# Patient Record
Sex: Male | Born: 1945 | Race: Black or African American | Hispanic: No | Marital: Married | Smoking: Former smoker
Health system: Southern US, Community
[De-identification: ages and names within clinical notes are randomized; demographics above are authoritative.]

## PROBLEM LIST (undated history)

## (undated) DIAGNOSIS — R3911 Hesitancy of micturition: Secondary | ICD-10-CM

## (undated) DIAGNOSIS — F419 Anxiety disorder, unspecified: Secondary | ICD-10-CM

## (undated) DIAGNOSIS — I10 Essential (primary) hypertension: Secondary | ICD-10-CM

## (undated) DIAGNOSIS — N433 Hydrocele, unspecified: Secondary | ICD-10-CM

## (undated) DIAGNOSIS — N4340 Spermatocele of epididymis, unspecified: Secondary | ICD-10-CM

---

## 1996-03-28 HISTORY — PX: UMBILICAL HERNIA REPAIR: SHX196

## 2011-04-18 NOTE — Progress Notes (Signed)
Pt states is rescheduling in a month because he has the flu.

## 2011-05-24 NOTE — Progress Notes (Signed)
PT STATES WIFE HAD HEART SURG. AND IN HOSPITAL. WILL RESCHEDULE, LM W/ CHASITY TO CALL PT.

## 2011-05-26 ENCOUNTER — Other Ambulatory Visit: Payer: Self-pay | Admitting: Urology

## 2011-05-30 ENCOUNTER — Encounter (HOSPITAL_BASED_OUTPATIENT_CLINIC_OR_DEPARTMENT_OTHER): Admission: RE | Payer: Self-pay | Source: Ambulatory Visit

## 2011-05-30 ENCOUNTER — Ambulatory Visit (HOSPITAL_BASED_OUTPATIENT_CLINIC_OR_DEPARTMENT_OTHER): Admission: RE | Admit: 2011-05-30 | Payer: Self-pay | Source: Ambulatory Visit | Admitting: Urology

## 2011-05-30 SURGERY — EXPLORATION, SCROTUM
Anesthesia: General | Laterality: Right

## 2011-06-10 ENCOUNTER — Encounter (HOSPITAL_COMMUNITY): Payer: Self-pay | Admitting: Emergency Medicine

## 2011-06-10 ENCOUNTER — Emergency Department (HOSPITAL_COMMUNITY)
Admission: EM | Admit: 2011-06-10 | Discharge: 2011-06-10 | Disposition: A | Discharge status: Home / Self Care | Payer: 59 | Attending: Emergency Medicine | Admitting: Emergency Medicine

## 2011-06-10 DIAGNOSIS — L259 Unspecified contact dermatitis, unspecified cause: Secondary | ICD-10-CM | POA: Insufficient documentation

## 2011-06-10 HISTORY — DX: Essential (primary) hypertension: I10

## 2011-06-10 HISTORY — DX: Hesitancy of micturition: R39.11

## 2011-06-10 MED ORDER — HYDROXYZINE HCL 25 MG PO TABS: 25.0000 mg | ORAL_TABLET | Freq: Four times a day (QID) | ORAL | Status: AC

## 2011-06-10 MED ORDER — HYDROCORTISONE 1 % EX OINT: TOPICAL_OINTMENT | Freq: Two times a day (BID) | CUTANEOUS | Status: DC

## 2011-06-10 NOTE — Discharge Instructions (Signed)

## 2011-06-10 NOTE — ED Provider Notes (Signed)
Medical screening examination/treatment/procedure(s) were performed by non-physician practitioner and as supervising physician I was immediately available for consultation/collaboration.   Loren Racer, MD 06/10/11 2257

## 2011-06-10 NOTE — ED Notes (Signed)
PT reports two "small lumps" that started on right forearm yesterday, now has spread to a contact-dermatitis like pattern with small pustules/fluid-filled spots, slight drainage, and red/pink skin around area. Reports itching.

## 2011-06-10 NOTE — ED Provider Notes (Signed)
History     CSN: [id number]  Arrival date & time 06/10/11  1638   First MD Initiated Contact with Patient 06/10/11 1642      No chief complaint on file.   (Consider location/radiation/quality/duration/timing/severity/associated sxs/prior treatment) HPI Comments: Patient presents emergency department chief complaint of pruritic rash on right anterior forearm.  He noticed a rash yesterday and states that it has been spreading.  The patient has not had any treatments on the rash and states that it is nowhere else on his body.  He has not tried any new lotions or products.  [place] not [place] working.  Patient denies fevers, night sweats, chills, and has no other complaints at this time.  The history is provided by the patient.    No past medical history on file.  No past surgical history on file.  No family history on file.  History  Substance Use Topics  . Smoking status: Not on file  . Smokeless tobacco: Not on file  . Alcohol Use: Not on file      Review of Systems  Constitutional: Negative for fever, chills and appetite change.  HENT: Negative for congestion.   Eyes: Negative for visual disturbance.  Respiratory: Negative for shortness of breath.   Cardiovascular: Negative for chest pain and leg swelling.  Gastrointestinal: Negative for abdominal pain.  Genitourinary: Negative for dysuria, urgency and frequency.  Skin: Positive for rash.  Neurological: Negative for dizziness, syncope, weakness, light-headedness, numbness and headaches.  Psychiatric/Behavioral: Negative for confusion.    Allergies  Review of patient's allergies indicates no known allergies.  Home Medications   Current Outpatient Rx  Name Route Sig Dispense Refill  . IBUPROFEN 200 MG PO TABS Oral Take 800 mg by mouth every 6 (six) hours as needed. pain    . LORAZEPAM 0.5 MG PO TABS Oral Take 0.5 mg by mouth daily.    [name] TAMSULOSIN HCL 0.4 MG PO CAPS  Oral Take 0.4 mg by mouth daily.    [name] VALSARTAN-HYDROCHLOROTHIAZIDE 320-25 MG PO TABS Oral Take 1 tablet by mouth daily.      There were no vitals taken for this visit.  Physical Exam  Nursing note and vitals reviewed. Constitutional: He is oriented to person, place, and time. He appears well-developed and well-nourished. No distress.  HENT:  Head: Normocephalic and atraumatic.  Eyes: Conjunctivae and EOM are normal.  Neck: Normal range of motion.  Pulmonary/Chest: Effort normal.  Musculoskeletal: Normal range of motion.  Neurological: He is alert and oriented to person, place, and time.  Skin: Skin is warm and dry. Rash noted. He is not diaphoretic.       Mild dermatitis with papules and pustules located on right anterior forearm in linear streaks. Erythema base, no warmth or weeping.   Psychiatric: He has a normal mood and affect. His behavior is normal.    ED Course  Procedures (including critical care time)  Labs Reviewed - No data to display No results found.   No diagnosis found.    MDM  Contact dermatitis, question poison ivy   Patient will be given a prescription of Atarax and hydrocortisone cream to be used twice daily.  Patient is advised followup with primary care physician or return to emergency department if symptoms worsen, he developed a fever.  One-to-one or base erythema spreads further.  Patient is advised that this could be very contagious and to keep the area free from contact.  Jaci Carrel, [place] 06/10/11 1736

## 2011-06-13 ENCOUNTER — Encounter (HOSPITAL_BASED_OUTPATIENT_CLINIC_OR_DEPARTMENT_OTHER): Payer: Self-pay | Admitting: *Deleted

## 2011-06-13 NOTE — Progress Notes (Signed)
NPO AFTER MN. ARRIVES AT 0615. NEEDS ISTAT AND EKG. REVIEWED [facility] GUIDELINES, WILL BRING MEDS.

## 2011-06-17 NOTE — H&P (Signed)
  Jesus [name] is an 66 y.o. male.   Chief Complaint:Right scrotal swelling/ mass. 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 HPI: Jesus [name] returns today for a follow-up. He wanted to further discuss his upcoming surgery. He is scheduled now for a scrotal exploration and treatment of what may end up being a substantially large multiloculated spermatocele versus some component of hydrocele as well. He has had a markedly enlarged hemi-scrotum. Previous ultrasound has clearly shown this to be some cystic abnormality but the exact etiology spermatocele versus cystic degeneration of the epididymis versus hydrocele of the testes/cord has not been completely determined. Again, he clearly is symptomatic from this and is here today to further discuss the surgery, what to expect from a recovery standpoint, potential complications, etc.   Past Medical History  Diagnosis Date  . Hypertension   . Urinary hesitancy   . Hydrocele, right   . Spermatocele   . Anxiety     Past Surgical History  Procedure Date  . Umbilical hernia repair 1998    History reviewed. No pertinent family history. Social History:  reports that he quit smoking about 53 years ago. His smoking use included Cigarettes. He has never used smokeless tobacco. He reports that he drinks alcohol. He reports that he does not use illicit drugs.  Allergies: No Known Allergies  No current facility-administered medications on file as of .   No current outpatient prescriptions on file as of .    No results found for this or any previous visit (from the past 48 hour(s)). No results found.  Review of Systems - Negative except scrotal swelling, ED, anxiety.  Height 6' (1.829 m), weight 108.863 kg (240 lb). General appearance: alert, cooperative and no distress Neck: no adenopathy and no JVD Resp: clear to auscultation bilaterally Cardio: regular rate and rhythm GI: soft, non-tender; bowel sounds normal; no masses,  no organomegaly Male genitalia: normal,  penis: no lesions or discharge. testes: no masses or tenderness. no hernias Massive r sided scrotal swelling. Testis non-palpable that side.  Extremities: extremities normal, atraumatic, no cyanosis or edema Skin: Skin color, texture, turgor normal. No rashes or lesions Neurologic: Grossly normal  Assessment/Plan [name] clearly has a markedly enlarged scrotum and is on the schedule for a scrotal exploration with correction of the problem coming up in the next couple of weeks. We again answered his questions and went over what to expect, the nature of the surgery and what the findings might be and what to expect from a recovery standpoint. He understands there can be quite a bit of variability between patients and therefore it is difficult to predict with any certainty. Given the size of this hemi-scrotum, we felt that keeping him overnight for observation along with a scrotal drain would make the most sense to reduce the risk of substantial scrotal hematoma. The drain probably can be removed in the morning but may have to go home with him for a few days depending on the situation. He was interested potentially in a temporizing maneuver but I think any type of drainage of this would be a mistake and this ought to be either postponed or dealt with in a more formal way. He wants to go ahead with the procedure and have answered his questions and discussed things with him today.    Nathania Waldman S 06/17/2011, 12:14 PM

## 2011-06-18 ENCOUNTER — Emergency Department (HOSPITAL_COMMUNITY)
Admission: EM | Admit: 2011-06-18 | Discharge: 2011-06-18 | Disposition: A | Discharge status: Home / Self Care | Payer: 59 | Attending: Emergency Medicine | Admitting: Emergency Medicine

## 2011-06-18 ENCOUNTER — Encounter (HOSPITAL_COMMUNITY): Payer: Self-pay

## 2011-06-18 DIAGNOSIS — L259 Unspecified contact dermatitis, unspecified cause: Secondary | ICD-10-CM | POA: Insufficient documentation

## 2011-06-18 DIAGNOSIS — L298 Other pruritus: Secondary | ICD-10-CM | POA: Insufficient documentation

## 2011-06-18 DIAGNOSIS — R21 Rash and other nonspecific skin eruption: Secondary | ICD-10-CM | POA: Insufficient documentation

## 2011-06-18 DIAGNOSIS — Z79899 Other long term (current) drug therapy: Secondary | ICD-10-CM | POA: Insufficient documentation

## 2011-06-18 DIAGNOSIS — L2989 Other pruritus: Secondary | ICD-10-CM | POA: Insufficient documentation

## 2011-06-18 DIAGNOSIS — I10 Essential (primary) hypertension: Secondary | ICD-10-CM | POA: Insufficient documentation

## 2011-06-18 MED ORDER — PREDNISONE 20 MG PO TABS: ORAL_TABLET | ORAL | Status: DC

## 2011-06-18 MED ORDER — DEXAMETHASONE SODIUM PHOSPHATE 10 MG/ML IJ SOLN: 10.0000 mg | Freq: Once | INTRAMUSCULAR | Status: DC

## 2011-06-18 MED ORDER — DEXAMETHASONE SODIUM PHOSPHATE 10 MG/ML IJ SOLN
10.0000 mg | Freq: Once | INTRAMUSCULAR | Status: AC
  Administered 2011-06-18: 10 mg via INTRAMUSCULAR

## 2011-06-18 MED ORDER — PREDNISONE 20 MG PO TABS: ORAL_TABLET | ORAL | Status: AC

## 2011-06-18 NOTE — ED Notes (Signed)
Pt seen here for presenting complaint on 06/10/11. Given cream and tablets for itching taken with no relief.  Pt presents with generalized rash that has spread to face.  Pt here for reevaluation

## 2011-06-18 NOTE — ED Notes (Signed)
Schedule for surgery on Monday for scrotal exploration here at [facility]- does not want to reschedule

## 2011-06-18 NOTE — ED Provider Notes (Signed)
History     CSN: [id number]  Arrival date & time 06/18/11  1735   First MD Initiated Contact with Patient 06/18/11 1807      Chief Complaint  Patient presents with  . Rash    (Consider location/radiation/quality/duration/timing/severity/associated sxs/prior treatment) HPI Comments: Patient presents with a rash. Patient was seen previously in emergency department for the same on 06/10/11. Patient was diagnosed with contact dermatitis and discharged home on hydrocortisone ointment and hydroxyzine. Patient states these did not help. He has also tried calamine lotion and cornstarch at home. Patient states the rash is itchy. He has had blisters with clear drainage. Complains that rash is worse on right arm. Over the past several days he is also had itching around his eyes. Patient does not know what is causing his rash. [place] and he thinks his dog may have gotten into some poison ivy. Of note, patient has a urological procedure scheduled for 2 days. He denies fever, chills, nausea or vomiting.  Patient is a 66 y.o. male presenting with rash. The history is provided by the patient.  Rash  This is a new problem. The current episode started more than 1 week ago. The problem has not changed since onset.The problem is associated with an unknown factor. There has been no fever. The rash is present on the left arm, right arm and face. The patient is experiencing no pain. Associated symptoms include blisters, itching and weeping. Pertinent negatives include no pain. He has tried antihistamines and steriods for the symptoms.    Past Medical History  Diagnosis Date  . Hypertension   . Urinary hesitancy   . Hydrocele, right   . Spermatocele   . Anxiety     Past Surgical History  Procedure Date  . Umbilical hernia repair 1998    No family history on file.  History  Substance Use Topics  . Smoking status: Former Smoker    Types: Cigarettes    Quit date: 03/28/1958  . Smokeless  tobacco: Never Used  . Alcohol Use: Yes     1-2 drinks per week      Review of Systems  Constitutional: Negative for fever.  HENT: Negative for facial swelling, trouble swallowing and neck pain.   Eyes: Negative for redness.  Respiratory: Negative for shortness of breath, wheezing and stridor.   Cardiovascular: Negative for chest pain.  Gastrointestinal: Negative for nausea and vomiting.  Skin: Positive for itching and rash.    Allergies  Review of patient's allergies indicates no known allergies.  Home Medications   Current Outpatient Rx  Name Route Sig Dispense Refill  . DIPHENHYDRAMINE HCL 25 MG PO CAPS Oral Take 25 mg by mouth every 6 (six) hours as needed. For allergy    . HYDROCORTISONE 1 % EX OINT Topical Apply topically 2 (two) times daily. 30 g 0  . HYDROXYZINE HCL 25 MG PO TABS Oral Take 1 tablet (25 mg total) by mouth every 6 (six) hours. 12 tablet 0  . IBUPROFEN 200 MG PO TABS Oral Take 800 mg by mouth every 6 (six) hours as needed. pain    . LORAZEPAM 0.5 MG PO TABS Oral Take 0.5 mg by mouth 2 (two) times daily as needed. For sleep/anxiety    . NEOMYCIN-POLYMYXIN-PRAMOXINE 1 % EX CREA Topical Apply 1 application topically 2 (two) times daily as needed. Apply to rash    . TAMSULOSIN HCL 0.4 MG PO CAPS Oral Take 0.4 mg by mouth daily.    [name]  VALSARTAN-HYDROCHLOROTHIAZIDE 320-25 MG PO TABS Oral Take 1 tablet by mouth daily.    [name] PREDNISONE 20 MG PO TABS  3 Tabs PO Days 1-3, then 2 tabs PO Days 4-6, then 1 tab PO Day 7-9, then Half Tab PO Day 10-12 20 tablet 0    BP 157/106  Pulse 101  Temp(Src) 98.9 F (37.2 C) (Oral)  Resp 20  SpO2 96%  Physical Exam  Nursing note and vitals reviewed. Constitutional: He is oriented to person, place, and time. He appears well-developed and well-nourished.  HENT:  Head: Normocephalic and atraumatic.       Light pink maculopapular areas around the eyes. Mild excoriation noted.  Eyes: Conjunctivae and EOM are normal. Pupils are  equal, round, and reactive to light. Right eye exhibits no discharge. Left eye exhibits no discharge.       Sclera, conjunctivae, and corneas appear normal.   Neck: Normal range of motion. Neck supple.  Pulmonary/Chest: No respiratory distress.  Neurological: He is alert and oriented to person, place, and time.  Skin: Skin is warm and dry.       Dry crust on the right forearm. No active drainage. There are small papules noted. There is excoriation noted. The patient has very minor symptoms similar on the left arm.  Psychiatric: He has a normal mood and affect.    ED Course  Procedures (including critical care time)  Labs Reviewed - No data to display No results found.   1. Dermatitis, contact    6:30PM Patient seen and examined. Medications ordered.   Vital signs reviewed and are as follows: Filed Vitals:   06/18/11 1745  BP: 157/106  Pulse:   Temp:   Resp:    BP 157/106  Pulse 101  Temp(Src) 98.9 F (37.2 C) (Oral)  Resp 20  SpO2 96%  Dr. Adriana Simas has seen patient. I have discussed patient with him.  Decadron given in ED. Patient aware that this could interfere with urological procedure planned in 2 days. Will treat given facial involvement around eyes.   MDM  Rash, consistent with contact dermatitis. Will treat systemically with steroids. No concern for meningitis. No ocular involvement noted. Normal vision.         Renne Crigler, [place] 06/18/11 2010

## 2011-06-18 NOTE — ED Provider Notes (Signed)
Medical screening examination/treatment/procedure(s) were conducted as a shared visit with non-physician practitioner(s) and myself.  I personally evaluated the patient during the encounter.  Rash most consistent with an allergic phenomenon. Will Rx IM steroids and by mouth steroids.  Donnetta Hutching, MD 06/18/11 [phone]

## 2011-06-18 NOTE — Discharge Instructions (Signed)
Please read and follow all provided instructions.  Your diagnoses today include:  1. Dermatitis, contact     Tests performed today include:  Vital signs. See below for your results today.   Medications prescribed:   Prednisone - steroid medication  Take any prescribed medications only as directed.  Home care instructions:  Follow any educational materials contained in this packet.  Follow-up instructions: Please follow-up with your primary care provider in the next 3 days for further evaluation of your symptoms. If you do not have a primary care doctor -- see below for referral information.   Return instructions:   Please return to the Emergency Department if you experience worsening symptoms.   Please return if you have any other emergent concerns.  Additional Information:  Your vital signs today were: BP 157/106  Pulse 101  Temp(Src) 98.9 F (37.2 C) (Oral)  Resp 20  SpO2 96% If your blood pressure (BP) was elevated above 135/85 this visit, please have this repeated by your doctor within one month. -------------- No Primary Care Doctor Call [facility]  [phone] Other agencies that provide inexpensive medical care    [facility]  [phone]    [facility]  [phone]    [facility]  [phone]    [facility]  [phone]    [facility]  [phone]    [facility]  [phone] -------------- RESOURCE GUIDE:  Dental Problems  Patients with Medicaid: [facility] [phone]                                            [phone] [address] Phone:  [phone]                                                   Phone:  [phone]  If unable to pay or uninsured, contact:  [facility] or [facility]. to become qualified for the adult dental clinic.  Chronic Pain Problems Contact [facility]  [phone] Patients need to be  referred by their primary care doctor.  Insufficient Money for Medicine Contact [employer]:  call "211" or [facility] [phone].  Psychological Services [facility]  [phone] [facility]  [phone] [facility] Mental [facility]   [phone] (emergency services [phone])  Substance Abuse Resources [facility]  [phone] [facility] [phone] [facility] [place] [phone] Floydene Flock [phone] Residential & Outpatient Substance Abuse Program  [phone]  Abuse/Neglect [facility] Child Abuse Hotline [phone] [facility] Child Abuse Hotline [phone] (After Hours)  Emergency Shelter [facility] [phone]  Maternity Homes [facility] [place] [facility] [phone] [facility] [phone]  [facility] Resources  [facility] [place]     [employer]                          [facility] Dept. [address] [place]  [address]      [address]  Blondell Reveal Phone:  [phone]                                   Phone:  [phone]                 Phone:  [phone]  [facility] Phone:  [phone]  [facility] Child Abuse Hotline [phone] [phone] (After Hours)

## 2011-06-20 ENCOUNTER — Encounter (HOSPITAL_BASED_OUTPATIENT_CLINIC_OR_DEPARTMENT_OTHER)
Admission: RE | Disposition: A | Discharge status: Home / Self Care | Payer: Self-pay | Source: Ambulatory Visit | Attending: Urology

## 2011-06-20 ENCOUNTER — Ambulatory Visit (HOSPITAL_BASED_OUTPATIENT_CLINIC_OR_DEPARTMENT_OTHER): Payer: 59 | Admitting: Anesthesiology

## 2011-06-20 ENCOUNTER — Ambulatory Visit (HOSPITAL_BASED_OUTPATIENT_CLINIC_OR_DEPARTMENT_OTHER)
Admission: RE | Admit: 2011-06-20 | Discharge: 2011-06-21 | Disposition: A | Discharge status: Home / Self Care | Payer: 59 | Source: Ambulatory Visit | Attending: Urology | Admitting: Urology

## 2011-06-20 ENCOUNTER — Encounter (HOSPITAL_BASED_OUTPATIENT_CLINIC_OR_DEPARTMENT_OTHER): Payer: Self-pay | Admitting: *Deleted

## 2011-06-20 ENCOUNTER — Encounter (HOSPITAL_BASED_OUTPATIENT_CLINIC_OR_DEPARTMENT_OTHER): Payer: Self-pay | Admitting: Anesthesiology

## 2011-06-20 ENCOUNTER — Other Ambulatory Visit: Payer: Self-pay

## 2011-06-20 DIAGNOSIS — N4340 Spermatocele of epididymis, unspecified: Secondary | ICD-10-CM

## 2011-06-20 DIAGNOSIS — I10 Essential (primary) hypertension: Secondary | ICD-10-CM | POA: Insufficient documentation

## 2011-06-20 DIAGNOSIS — N433 Hydrocele, unspecified: Secondary | ICD-10-CM

## 2011-06-20 HISTORY — PX: SCROTAL EXPLORATION: SHX2386

## 2011-06-20 HISTORY — PX: HYDROCELE EXCISION: SHX482

## 2011-06-20 HISTORY — DX: Spermatocele of epididymis, unspecified: N43.40

## 2011-06-20 HISTORY — DX: Hydrocele, unspecified: N43.3

## 2011-06-20 HISTORY — DX: Anxiety disorder, unspecified: F41.9

## 2011-06-20 HISTORY — PX: SPERMATOCELECTOMY: SHX2420

## 2011-06-20 LAB — POCT I-STAT 4, (NA,K, GLUC, HGB,HCT)
Glucose, Bld: 107 mg/dL — ABNORMAL HIGH (ref 70–99)
HCT: 46 % (ref 39.0–52.0)
Hemoglobin: 15.6 g/dL (ref 13.0–17.0)
Potassium: 3.6 mEq/L (ref 3.5–5.1)
Sodium: 142 mEq/L (ref 135–145)

## 2011-06-20 SURGERY — EXPLORATION, SCROTUM
Anesthesia: General | Site: Scrotum | Laterality: Right | Wound class: Clean

## 2011-06-20 MED ORDER — 0.9 % SODIUM CHLORIDE (POUR BTL) OPTIME
TOPICAL | Status: DC | PRN
  Administered 2011-06-20: 500 mL

## 2011-06-20 MED ORDER — CEFAZOLIN SODIUM 1-5 GM-% IV SOLN
1.0000 g | Freq: Three times a day (TID) | INTRAVENOUS | Status: DC
  Administered 2011-06-20 – 2011-06-21 (×3): 1 g via INTRAVENOUS

## 2011-06-20 MED ORDER — HYDROCORTISONE 1 % EX OINT
TOPICAL_OINTMENT | Freq: Two times a day (BID) | CUTANEOUS | Status: DC
  Administered 2011-06-20: 22:00:00 via TOPICAL

## 2011-06-20 MED ORDER — PROPOFOL 10 MG/ML IV EMUL
INTRAVENOUS | Status: DC | PRN
  Administered 2011-06-20: 50 mg via INTRAVENOUS
  Administered 2011-06-20: 250 mg via INTRAVENOUS

## 2011-06-20 MED ORDER — FENTANYL CITRATE 0.05 MG/ML IJ SOLN
25.0000 ug | INTRAMUSCULAR | Status: DC | PRN
  Administered 2011-06-20: 25 ug via INTRAVENOUS
  Administered 2011-06-20: 50 ug via INTRAVENOUS
  Administered 2011-06-20 (×3): 25 ug via INTRAVENOUS

## 2011-06-20 MED ORDER — MORPHINE SULFATE 2 MG/ML IJ SOLN
2.0000 mg | INTRAMUSCULAR | Status: DC | PRN
  Administered 2011-06-20 (×2): 4 mg via INTRAVENOUS
  Administered 2011-06-20: 2 mg via INTRAVENOUS

## 2011-06-20 MED ORDER — HYDROCODONE-ACETAMINOPHEN 5-325 MG PO TABS
1.0000 | ORAL_TABLET | ORAL | Status: DC | PRN
  Administered 2011-06-20 (×2): 1 via ORAL
  Administered 2011-06-20 – 2011-06-21 (×3): 2 via ORAL

## 2011-06-20 MED ORDER — CEFAZOLIN SODIUM 1-5 GM-% IV SOLN
1.0000 g | INTRAVENOUS | Status: AC
  Administered 2011-06-20: 2 g via INTRAVENOUS

## 2011-06-20 MED ORDER — MIDAZOLAM HCL 5 MG/5ML IJ SOLN
INTRAMUSCULAR | Status: DC | PRN
  Administered 2011-06-20: 2 mg via INTRAVENOUS

## 2011-06-20 MED ORDER — MEPERIDINE HCL 25 MG/ML IJ SOLN: 6.2500 mg | INTRAMUSCULAR | Status: DC | PRN

## 2011-06-20 MED ORDER — NEOMYCIN-POLYMYXIN-PRAMOXINE 1 % EX CREA
1.0000 "application " | TOPICAL_CREAM | Freq: Two times a day (BID) | CUTANEOUS | Status: DC
  Administered 2011-06-20: 1 via TOPICAL

## 2011-06-20 MED ORDER — BUPIVACAINE HCL (PF) 0.25 % IJ SOLN
INTRAMUSCULAR | Status: DC | PRN
  Administered 2011-06-20: 9 mL

## 2011-06-20 MED ORDER — ONDANSETRON HCL 4 MG/2ML IJ SOLN: 4.0000 mg | INTRAMUSCULAR | Status: DC | PRN

## 2011-06-20 MED ORDER — VALSARTAN-HYDROCHLOROTHIAZIDE 320-25 MG PO TABS: 1.0000 | ORAL_TABLET | Freq: Every day | ORAL | Status: DC

## 2011-06-20 MED ORDER — FENTANYL CITRATE 0.05 MG/ML IJ SOLN
INTRAMUSCULAR | Status: DC | PRN
  Administered 2011-06-20 (×2): 25 ug via INTRAVENOUS
  Administered 2011-06-20 (×3): 50 ug via INTRAVENOUS

## 2011-06-20 MED ORDER — LORAZEPAM 0.5 MG PO TABS: 0.5000 mg | ORAL_TABLET | Freq: Two times a day (BID) | ORAL | Status: DC | PRN

## 2011-06-20 MED ORDER — LIDOCAINE HCL (CARDIAC) 20 MG/ML IV SOLN
INTRAVENOUS | Status: DC | PRN
  Administered 2011-06-20: 100 mg via INTRAVENOUS

## 2011-06-20 MED ORDER — LACTATED RINGERS IV SOLN
INTRAVENOUS | Status: DC
  Administered 2011-06-20: 07:00:00 via INTRAVENOUS

## 2011-06-20 MED ORDER — ONDANSETRON HCL 4 MG/2ML IJ SOLN
INTRAMUSCULAR | Status: DC | PRN
  Administered 2011-06-20: 4 mg via INTRAVENOUS

## 2011-06-20 MED ORDER — DIPHENHYDRAMINE HCL 25 MG PO CAPS: 25.0000 mg | ORAL_CAPSULE | Freq: Four times a day (QID) | ORAL | Status: DC | PRN

## 2011-06-20 MED ORDER — DEXAMETHASONE SODIUM PHOSPHATE 4 MG/ML IJ SOLN
INTRAMUSCULAR | Status: DC | PRN
  Administered 2011-06-20: 12 mg via INTRAVENOUS

## 2011-06-20 MED ORDER — KCL IN DEXTROSE-NACL 20-5-0.45 MEQ/L-%-% IV SOLN: INTRAVENOUS | Status: DC

## 2011-06-20 MED ORDER — LACTATED RINGERS IV SOLN
INTRAVENOUS | Status: DC
  Administered 2011-06-20: 11:00:00 via INTRAVENOUS

## 2011-06-20 MED ORDER — CEFAZOLIN SODIUM 1-5 GM-% IV SOLN: 1.0000 g | INTRAVENOUS | Status: DC

## 2011-06-20 MED ORDER — TAMSULOSIN HCL 0.4 MG PO CAPS: 0.4000 mg | ORAL_CAPSULE | Freq: Every day | ORAL | Status: DC

## 2011-06-20 MED ORDER — PROMETHAZINE HCL 25 MG/ML IJ SOLN: 6.2500 mg | INTRAMUSCULAR | Status: DC | PRN

## 2011-06-20 MED ORDER — HYDROXYZINE HCL 25 MG PO TABS: 25.0000 mg | ORAL_TABLET | Freq: Four times a day (QID) | ORAL | Status: DC

## 2011-06-20 SURGICAL SUPPLY — 45 items
APPLICATOR COTTON TIP 6IN STRL (MISCELLANEOUS) ×2 IMPLANT
BANDAGE GAUZE ELAST BULKY 4 IN (GAUZE/BANDAGES/DRESSINGS) ×2 IMPLANT
BLADE SURG 10 STRL SS (BLADE) IMPLANT
BLADE SURG 15 STRL LF DISP TIS (BLADE) ×1 IMPLANT
BLADE SURG 15 STRL SS (BLADE) ×1
BLADE SURG ROTATE 9660 (MISCELLANEOUS) ×2 IMPLANT
CANISTER SUCTION 1200CC (MISCELLANEOUS) IMPLANT
CANISTER SUCTION 2500CC (MISCELLANEOUS) ×4 IMPLANT
CLEANER CAUTERY TIP 5X5 PAD (MISCELLANEOUS) ×1 IMPLANT
CLOTH BEACON ORANGE TIMEOUT ST (SAFETY) ×2 IMPLANT
COVER MAYO STAND STRL (DRAPES) ×2 IMPLANT
COVER TABLE BACK 60X90 (DRAPES) ×2 IMPLANT
DISSECTOR ROUND CHERRY 3/8 STR (MISCELLANEOUS) IMPLANT
DRAIN PENROSE 18X1/4 LTX STRL (WOUND CARE) ×2 IMPLANT
DRAPE PED LAPAROTOMY (DRAPES) ×2 IMPLANT
DRSG TEGADERM 4X4.75 (GAUZE/BANDAGES/DRESSINGS) ×2 IMPLANT
ELECT REM PT RETURN 9FT ADLT (ELECTROSURGICAL) ×2
ELECTRODE REM PT RTRN 9FT ADLT (ELECTROSURGICAL) ×1 IMPLANT
GLOVE BIOGEL M 7.0 STRL (GLOVE) ×2 IMPLANT
GOWN ISOL BLUE XXL (GOWNS) ×2 IMPLANT
GOWN SURGICAL XLG (GOWNS) ×2 IMPLANT
LOOP VESSEL MAXI BLUE (MISCELLANEOUS) IMPLANT
NEEDLE HYPO 22GX1.5 SAFETY (NEEDLE) ×2 IMPLANT
NS IRRIG 500ML POUR BTL (IV SOLUTION) ×2 IMPLANT
PACK BASIN DAY SURGERY FS (CUSTOM PROCEDURE TRAY) ×2 IMPLANT
PAD CLEANER CAUTERY TIP 5X5 (MISCELLANEOUS) ×1
PENCIL BUTTON HOLSTER BLD 10FT (ELECTRODE) ×2 IMPLANT
STRIP CLOSURE SKIN 1/2X4 (GAUZE/BANDAGES/DRESSINGS) IMPLANT
SUPPORT SCROTAL LG STRP (MISCELLANEOUS) ×2 IMPLANT
SUT SILK 0 SH 30 (SUTURE) IMPLANT
SUT SILK 0 TIES 10X30 (SUTURE) IMPLANT
SUT SILK 3 0 PS 1 (SUTURE) ×2 IMPLANT
SUT VIC AB 2-0 CT1 27 (SUTURE)
SUT VIC AB 2-0 CT1 TAPERPNT 27 (SUTURE) IMPLANT
SUT VIC AB 3-0 CT1 36 (SUTURE) IMPLANT
SUT VIC AB 3-0 SH 27 (SUTURE) ×2
SUT VIC AB 3-0 SH 27X BRD (SUTURE) ×2 IMPLANT
SUT VICRYL 4-0 PS2 18IN ABS (SUTURE) ×2 IMPLANT
SYR BULB IRRIGATION 50ML (SYRINGE) IMPLANT
SYR CONTROL 10ML LL (SYRINGE) ×2 IMPLANT
TOWEL NATURAL 6PK STERILE (DISPOSABLE) ×2 IMPLANT
TRAY DSU PREP LF (CUSTOM PROCEDURE TRAY) ×2 IMPLANT
TUBE CONNECTING 12X1/4 (SUCTIONS) IMPLANT
WATER STERILE IRR 500ML POUR (IV SOLUTION) IMPLANT
YANKAUER SUCT BULB TIP NO VENT (SUCTIONS) IMPLANT

## 2011-06-20 NOTE — Anesthesia Procedure Notes (Signed)
Procedure Name: LMA Insertion Date/Time: 06/20/2011 7:47 AM Performed by: Norva Pavlov Pre-anesthesia Checklist: Patient identified, Emergency Drugs available, Suction available and Patient being monitored Patient Re-evaluated:Patient Re-evaluated prior to inductionOxygen Delivery Method: Circle System Utilized Preoxygenation: Pre-oxygenation with 100% oxygen Intubation Type: IV induction Ventilation: Mask ventilation without difficulty LMA: LMA inserted LMA Size: 5.0 Number of attempts: 1 Airway Equipment and Method: bite block Placement Confirmation: positive ETCO2 Tube secured with: Tape Dental Injury: Teeth and Oropharynx as per pre-operative assessment

## 2011-06-20 NOTE — Anesthesia Preprocedure Evaluation (Addendum)
Anesthesia Evaluation  Patient identified by MRN, date of birth, ID band Patient awake    Reviewed: Allergy & Precautions, H&P , NPO status , Patient's Chart, lab work & pertinent test results  Airway Mallampati: II TM Distance: >3 FB Neck ROM: full    Dental No notable dental hx.    Pulmonary neg pulmonary ROS,  breath sounds clear to auscultation  Pulmonary exam normal       Cardiovascular Exercise Tolerance: Good hypertension, Pt. on medications negative cardio ROS  Rhythm:regular Rate:Normal     Neuro/Psych negative neurological ROS  negative psych ROS   GI/Hepatic negative GI ROS, Neg liver ROS,   Endo/Other  negative endocrine ROS  Renal/GU negative Renal ROS  negative genitourinary   Musculoskeletal   Abdominal   Peds  Hematology negative hematology ROS (+)   Anesthesia Other Findings Upper front gold tooth  Reproductive/Obstetrics negative OB ROS                          Anesthesia Physical Anesthesia Plan  ASA: II  Anesthesia Plan: General   Post-op Pain Management:    Induction:   Airway Management Planned: LMA  Additional Equipment:   Intra-op Plan:   Post-operative Plan:   Informed Consent: I have reviewed the patients History and Physical, chart, labs and discussed the procedure including the risks, benefits and alternatives for the proposed anesthesia with the patient or authorized representative who has indicated his/her understanding and acceptance.   Dental Advisory Given  Plan Discussed with: CRNA  Anesthesia Plan Comments:        Anesthesia Quick Evaluation

## 2011-06-20 NOTE — Discharge Instructions (Addendum)
Scrotal surgery postoperative instructions  Wound:  In most cases your incision will have absorbable sutures that will dissolve within the first 10-20 days. Some will fall out even earlier. Expect some redness as the sutures dissolved but this should occur only around the sutures. If there is generalized redness, especially with increasing pain or swelling, let us know. The scrotum will very likely get "black and blue" as the blood in the tissues spread. Sometimes the whole scrotum will turn colors. The black and blue is followed by a yellow and brown color. In time, all the discoloration will go away. In some cases some firm swelling in the area of the testicle may persist for up to 4-6 weeks after the surgery and is considered normal in most cases.  Diet:  You may return to your normal diet within 24 hours following your surgery. You may note some mild nausea and possibly vomiting the first 6-8 hours following surgery. This is usually due to the side effects of anesthesia, and will disappear quite soon. I would suggest clear liquids and a very light meal the first evening following your surgery.  Activity:  Your physical activity should be restricted the first 48 hours. During that time you should remain relatively inactive, moving about only when necessary. During the first 7-10 days following surgery he should avoid lifting any heavy objects (anything greater than 15 pounds), and avoid strenuous exercise. If you work, ask [place] specifically about your restrictions, both for work and home. We will write a note to your employer if needed.  You should plan to wear a tight pair of jockey shorts or an athletic supporter for the first 4-5 days, even to sleep. This will keep the scrotum immobilized to some degree and keep the swelling down.  Ice packs should be placed on and off over the scrotum for the first 48 hours. Frozen peas or corn in a ZipLock bag can be frozen, used and re-frozen. Fifteen minutes  on and 15 minutes off is a reasonable schedule. The ice is a good pain reliever and keeps the swelling down.  Hygiene:  You may shower 48 hours after your surgery. Tub bathing should be restricted until the seventh day.          Medication:  You will be sent home with some type of pain medication. In many cases you will be sent home with a narcotic pain pill (Vicodin or Tylox). If the pain is not too bad, you may take either Tylenol (acetaminophen) or Advil (ibuprofen) which contain no narcotic agents, and might be tolerated a little better, with fewer side effects. If the pain medication you are sent home with does not control the pain, you will have to let us know. Some narcotic pain medications cannot be given or refilled by a phone call to a pharmacy.  Problems you should report to [place]:   Fever of 101.0 degrees Fahrenheit or greater.  Moderate or severe swelling under the skin incision or involving the scrotum.  Drug reaction such as hives, a rash, nausea or vomiting.   will call about getting you in on Thursday for drain removal

## 2011-06-20 NOTE — Interval H&P Note (Signed)
History and Physical Interval Note:  06/20/2011 7:36 AM  Jesus [name]  has presented today for surgery, with the diagnosis of Right Hydrocele, Spermatocele  The various methods of treatment have been discussed with the patient and family. After consideration of risks, benefits and other options for treatment, the patient has consented to  Procedure(s) (LRB): SCROTUM EXPLORATION (N/A) as a surgical intervention .  The patients' history has been reviewed, patient examined, no change in status, stable for surgery.  I have reviewed the patients' chart and labs.  Questions were answered to the patient's satisfaction.     Velda Wendt S

## 2011-06-20 NOTE — Transfer of Care (Signed)
Immediate Anesthesia Transfer of Care Note  Patient: Jesus [name]  Procedure(s) Performed: Procedure(s) (LRB): SCROTUM EXPLORATION (Right) HYDROCELECTOMY ADULT (Right) SPERMATOCELECTOMY (Right)  Patient Location: PACU  Anesthesia Type: General  Level of Consciousness: awake, alert  and oriented  Airway & Oxygen Therapy: Patient Spontanous Breathing and Patient connected to face mask oxygen  Post-op Assessment: Report given to PACU RN and Post -op Vital signs reviewed and stable  Post vital signs: Reviewed and stable  Complications: No apparent anesthesia complications

## 2011-06-20 NOTE — Op Note (Signed)
Preoperative diagnosis: Large right hydrocele/spermatocele Postoperative diagnosis: Same  Procedure: Scrotal exploration with drainage and repair of large right loculated hydrocele. Small spermatocele excised   Surgeon: Valetta Fuller M.D.  Anesthesia: Gen.  Indications: Jesus [name] has had very long-standing and progressive right-sided scrotal swelling. This progressed to the point that the patient did have some difficulty with normal activities. His surgical procedures been postponed a couple of time for some medical and social reasons. Previous ultrasound showed a 17+ centimeter septated fluid collection in the right hemiscrotum. The patient underwent extensive discussion about the nature of the procedure the risks benefits and postoperative recovery issues. He is elected to proceed with surgery.     Technique and findings: Patient brought the operating room. Successful induction of general anesthesia. He was placed in supine position and prepped and draped in usual manner. Appropriate surgical timeout was performed. The patient had placement of PAS compression boots and received perioperative Ancef. Incision was made in the median raphae of the scrotum. This was carried down to what was clearly a somewhat thickened hydrocele sac. The hydrocele sac was opened and just over 2000 cc of straw-colored fluid was obtained. The testis was delivered. The hydrocele cell sac itself was multiloculated and some additional compartments were opened up. There were some cystic changes in the epididymis as well. Spermatocele was excised. A large amount of the redundant sac was excised with electrocautery given its thickness. The rest was plicated with some interrupted Vicryl suture. The entire scrotal cavity was copiously irrigated. A Marcaine spermatic cord block was performed. The testis was returned to the right hemiscrotum taking great care not to twist the spermatic cord in any manner. We elected to place a Penrose  drain in the in the dependent portion of the scrotum which was secured with a silk suture. The scrotum was closed with multiple layers of Vicryl suture. Patient was brought to recovery room in stable condition having had no obvious complications or problems.

## 2011-06-20 NOTE — Anesthesia Postprocedure Evaluation (Signed)
  Anesthesia Post-op Note  Patient: Jesus [name]  Procedure(s) Performed: Procedure(s) (LRB): SCROTUM EXPLORATION (Right) HYDROCELECTOMY ADULT (Right) SPERMATOCELECTOMY (Right)  Patient Location: PACU  Anesthesia Type: General  Level of Consciousness: awake and alert   Airway and Oxygen Therapy: Patient Spontanous Breathing  Post-op Pain: mild  Post-op Assessment: Post-op Vital signs reviewed, Patient's Cardiovascular Status Stable, Respiratory Function Stable, Patent Airway and No signs of Nausea or vomiting  Post-op Vital Signs: stable  Complications: No apparent anesthesia complications

## 2011-06-21 ENCOUNTER — Encounter (HOSPITAL_BASED_OUTPATIENT_CLINIC_OR_DEPARTMENT_OTHER): Payer: Self-pay | Admitting: Urology

## 2011-06-21 MED ORDER — HYDROCODONE-ACETAMINOPHEN 5-325 MG PO TABS
1.0000 | ORAL_TABLET | Freq: Four times a day (QID) | ORAL | Status: AC | PRN
Start: 2011-06-21 — End: 2011-07-01

## 2011-11-16 ENCOUNTER — Encounter (HOSPITAL_COMMUNITY): Payer: Self-pay | Admitting: *Deleted

## 2011-11-16 ENCOUNTER — Emergency Department (HOSPITAL_COMMUNITY)
Admission: EM | Admit: 2011-11-16 | Discharge: 2011-11-16 | Disposition: A | Discharge status: Home / Self Care | Payer: 59 | Attending: Emergency Medicine | Admitting: Emergency Medicine

## 2011-11-16 DIAGNOSIS — K0889 Other specified disorders of teeth and supporting structures: Secondary | ICD-10-CM

## 2011-11-16 DIAGNOSIS — Z87891 Personal history of nicotine dependence: Secondary | ICD-10-CM | POA: Insufficient documentation

## 2011-11-16 DIAGNOSIS — I10 Essential (primary) hypertension: Secondary | ICD-10-CM | POA: Insufficient documentation

## 2011-11-16 DIAGNOSIS — K089 Disorder of teeth and supporting structures, unspecified: Secondary | ICD-10-CM | POA: Insufficient documentation

## 2011-11-16 DIAGNOSIS — F411 Generalized anxiety disorder: Secondary | ICD-10-CM | POA: Insufficient documentation

## 2011-11-16 MED ORDER — HYDROCODONE-ACETAMINOPHEN 10-500 MG PO TABS: 1.0000 | ORAL_TABLET | Freq: Four times a day (QID) | ORAL | Status: AC | PRN

## 2011-11-16 MED ORDER — IBUPROFEN 800 MG PO TABS: 800.0000 mg | ORAL_TABLET | Freq: Three times a day (TID) | ORAL | Status: AC | PRN

## 2011-11-16 NOTE — ED Notes (Signed)
Pt states "had this toothache for 3 days, think it's an upper tooth"; pt indicates right side; pt presents with ?impacted right upper wisdom tooth, left upper wisdom tooth with decay.

## 2011-11-16 NOTE — ED Provider Notes (Signed)
History     CSN: [id number]  Arrival date & time 11/16/11  4098   First MD Initiated Contact with Patient 11/16/11 [phone]      Chief Complaint  Patient presents with  . Dental Pain    (Consider location/radiation/quality/duration/timing/severity/associated sxs/prior treatment) HPI Patient since emergency department with right upper third molar pain.  Patient states pain started 2 days ago.  Patient denies nausea, vomiting, fever, throat swelling, difficulty breathing, swelling under the tongue or weakness.  Patient, states that he tried hydrocodone, which he had left over from a previous surgery states that he did not get any relief with this medication.  Patient, states that the pressure seems to make the pain worse.  Past Medical History  Diagnosis Date  . Hypertension   . Urinary hesitancy   . Hydrocele, right   . Spermatocele   . Anxiety     Past Surgical History  Procedure Date  . Umbilical hernia repair 1998  . Scrotal exploration 06/20/2011    Procedure: SCROTUM EXPLORATION;  Surgeon: Valetta Fuller, MD;  Location: [facility];  Service: Urology;  Laterality: Right;  . Hydrocele excision 06/20/2011    Procedure: HYDROCELECTOMY ADULT;  Surgeon: Valetta Fuller, MD;  Location: [facility];  Service: Urology;  Laterality: Right;  . Spermatocelectomy 06/20/2011    Procedure: SPERMATOCELECTOMY;  Surgeon: Valetta Fuller, MD;  Location: [facility];  Service: Urology;  Laterality: Right;    No family history on file.  History  Substance Use Topics  . Smoking status: Former Smoker    Types: Cigarettes    Quit date: 03/28/1958  . Smokeless tobacco: Never Used  . Alcohol Use: Yes     1-2 drinks per week      Review of Systems All other systems negative except as documented in the HPI. All pertinent positives and negatives as reviewed in the HPI.  Allergies  Review of patient's allergies indicates no known allergies.  Home  Medications   Current Outpatient Rx  Name Route Sig Dispense Refill  . HYDROCODONE-ACETAMINOPHEN 5-325 MG PO TABS Oral Take 2 tablets by mouth every 6 (six) hours as needed. Pain    . IBUPROFEN 200 MG PO TABS Oral Take 600 mg by mouth every 6 (six) hours as needed. pain    . LORAZEPAM 1 MG PO TABS Oral Take 1 mg by mouth 2 (two) times daily.    [name] TAMSULOSIN HCL 0.4 MG PO CAPS Oral Take 0.4 mg by mouth daily.    [name] VALSARTAN-HYDROCHLOROTHIAZIDE 320-25 MG PO TABS Oral Take 1 tablet by mouth daily.      BP 145/94  Pulse 94  Temp 98.4 F (36.9 C) (Oral)  Resp 16  Wt 240 lb (108.863 kg)  SpO2 99%  Physical Exam  Nursing note and vitals reviewed. Constitutional: He appears well-developed and well-nourished. No distress.  HENT:  Head: No trismus in the jaw.  Mouth/Throat: Uvula is midline, oropharynx is clear and moist and mucous membranes are normal. Dental caries present. No dental abscesses or uvula swelling.    Cardiovascular: Normal rate, regular rhythm and normal heart sounds.     ED Course  Procedures (including critical care time)  Patient be referred dentistry on call, and told to return here for any worsening in his condition.  Told to rinse with warm water and peroxide 3 times a day.    MDM          Carlyle Dolly, PA-C 11/16/11  0822 

## 2011-11-18 NOTE — ED Provider Notes (Signed)
Medical screening examination/treatment/procedure(s) were performed by non-physician practitioner and as supervising physician I was immediately available for consultation/collaboration.   Hurman Horn, MD 11/18/11 2203

## 2012-05-07 ENCOUNTER — Encounter (HOSPITAL_COMMUNITY): Payer: Self-pay | Admitting: Emergency Medicine

## 2012-05-07 ENCOUNTER — Emergency Department (HOSPITAL_COMMUNITY)
Admission: EM | Admit: 2012-05-07 | Discharge: 2012-05-07 | Disposition: A | Discharge status: Home / Self Care | Payer: 59 | Attending: Emergency Medicine | Admitting: Emergency Medicine

## 2012-05-07 DIAGNOSIS — Z87448 Personal history of other diseases of urinary system: Secondary | ICD-10-CM | POA: Insufficient documentation

## 2012-05-07 DIAGNOSIS — F411 Generalized anxiety disorder: Secondary | ICD-10-CM | POA: Insufficient documentation

## 2012-05-07 DIAGNOSIS — I10 Essential (primary) hypertension: Secondary | ICD-10-CM | POA: Insufficient documentation

## 2012-05-07 DIAGNOSIS — R42 Dizziness and giddiness: Secondary | ICD-10-CM | POA: Insufficient documentation

## 2012-05-07 DIAGNOSIS — R3911 Hesitancy of micturition: Secondary | ICD-10-CM | POA: Insufficient documentation

## 2012-05-07 DIAGNOSIS — Z79899 Other long term (current) drug therapy: Secondary | ICD-10-CM | POA: Insufficient documentation

## 2012-05-07 DIAGNOSIS — R5381 Other malaise: Secondary | ICD-10-CM | POA: Insufficient documentation

## 2012-05-07 DIAGNOSIS — Z87891 Personal history of nicotine dependence: Secondary | ICD-10-CM | POA: Insufficient documentation

## 2012-05-07 LAB — COMPREHENSIVE METABOLIC PANEL
ALT: 22 U/L (ref 0–53)
Alkaline Phosphatase: 66 U/L (ref 39–117)
BUN: 14 mg/dL (ref 6–23)
Chloride: 101 mEq/L (ref 96–112)
GFR calc Af Amer: >90 mL/min (ref >90)
Glucose, Bld: 81 mg/dL (ref 70–99)
Potassium: 3.9 mEq/L (ref 3.5–5.1)
Sodium: 138 mEq/L (ref 135–145)
Total Bilirubin: 0.4 mg/dL (ref 0.3–1.2)

## 2012-05-07 LAB — CBC WITH DIFFERENTIAL/PLATELET
Hemoglobin: 15 g/dL (ref 13.0–17.0)
Lymphocytes Relative: 26 % (ref 12–46)
Lymphs Abs: 1.7 10*3/uL (ref 0.7–4.0)
Monocytes Relative: 6 % (ref 3–12)
Neutro Abs: 4.2 10*3/uL (ref 1.7–7.7)
Neutrophils Relative %: 67 % (ref 43–77)
RBC: 5.18 MIL/uL (ref 4.22–5.81)
WBC: 6.4 10*3/uL (ref 4.0–10.5)

## 2012-05-07 MED ORDER — MECLIZINE HCL 25 MG PO TABS: 25.0000 mg | ORAL_TABLET | Freq: Three times a day (TID) | ORAL | Status: DC | PRN

## 2012-05-07 NOTE — ED Notes (Addendum)
Pt c/o dizziness for several weeks with mild SOB. Pt states it feels like his "sinuses are making him dizzy".  Pt denies cough or chest pain or headache.  Pt states he originally thought his BP meds were making him dizzy so he cut down to a half of a pill but it did not help his dizziness.

## 2012-05-07 NOTE — ED Notes (Signed)
Pt reports dizziness since last week, sts it happens occasionally, pt also sts it may be due to "heart and blood pressure medicine I'm taking"

## 2012-05-07 NOTE — ED Provider Notes (Addendum)
History     CSN: [id number]  Arrival date & time 05/07/12  1402   First MD Initiated Contact with Patient 05/07/12 1501      Chief Complaint  Patient presents with  . Dizziness    (Consider location/radiation/quality/duration/timing/severity/associated sxs/prior treatment) HPI Comments: The patient presents with a two week history of intermittent dizzy episodes.  These seem to occur randomly with no precipitating factors.  These episodes last several minutes then seem to resolve on their own.  He reports a feeling of light-headedness, not a spinning sensation.    Patient is a 67 y.o. male presenting with weakness. The history is provided by the patient.  Weakness This is a new problem. Episode onset: 2 weeks ago. Episode frequency: intermittently. The problem has been gradually worsening. Pertinent negatives include no chest pain and no shortness of breath. Nothing aggravates the symptoms. Nothing relieves the symptoms. He has tried nothing for the symptoms. The treatment provided no relief.    Past Medical History  Diagnosis Date  . Hypertension   . Urinary hesitancy   . Hydrocele, right   . Spermatocele   . Anxiety     Past Surgical History  Procedure Laterality Date  . Umbilical hernia repair  1998  . Scrotal exploration  06/20/2011    Procedure: SCROTUM EXPLORATION;  Surgeon: Valetta Fuller, MD;  Location: [facility];  Service: Urology;  Laterality: Right;  . Hydrocele excision  06/20/2011    Procedure: HYDROCELECTOMY ADULT;  Surgeon: Valetta Fuller, MD;  Location: [facility];  Service: Urology;  Laterality: Right;  . Spermatocelectomy  06/20/2011    Procedure: SPERMATOCELECTOMY;  Surgeon: Valetta Fuller, MD;  Location: [facility];  Service: Urology;  Laterality: Right;    History reviewed. No pertinent family history.  History  Substance Use Topics  . Smoking status: Former Smoker    Types: Cigarettes    Quit date:  03/28/1958  . Smokeless tobacco: Never Used  . Alcohol Use: Yes     Comment: 1-2 drinks per week      Review of Systems  Respiratory: Negative for shortness of breath.   Cardiovascular: Negative for chest pain.  Neurological: Positive for weakness.  All other systems reviewed and are negative.    Allergies  Review of patient's allergies indicates no known allergies.  Home Medications   Current Outpatient Rx  Name  Route  Sig  Dispense  Refill  . ibuprofen (ADVIL,MOTRIN) 200 MG tablet   Oral   Take 400 mg by mouth every 6 (six) hours as needed. pain         . LORazepam (ATIVAN) 1 MG tablet   Oral   Take 1 mg by mouth 2 (two) times daily.         . Tamsulosin HCl (FLOMAX) 0.4 MG CAPS   Oral   Take 0.4 mg by mouth daily.         . valsartan-hydrochlorothiazide (DIOVAN-HCT) 320-25 MG per tablet   Oral   Take 0.5 tablets by mouth daily. Take 1/2 tablet once daily           BP 134/90  Pulse 105  Temp(Src) 97.8 F (36.6 C) (Oral)  Resp 18  SpO2 99%  Physical Exam  Nursing note and vitals reviewed. Constitutional: He appears well-developed and well-nourished. No distress.  HENT:  Head: Normocephalic and atraumatic.  Mouth/Throat: Oropharynx is clear and moist.  Eyes: EOM are normal. Pupils are equal, round, and reactive to  light. No scleral icterus.  Neck: Normal range of motion. Neck supple.  Cardiovascular: Normal rate and regular rhythm.   No murmur heard. Pulmonary/Chest: Effort normal and breath sounds normal. No respiratory distress. He has no wheezes.  Abdominal: Soft. Bowel sounds are normal. He exhibits no distension. There is no tenderness.  Musculoskeletal: Normal range of motion. He exhibits no edema.  Lymphadenopathy:    He has no cervical adenopathy.  Neurological: He is alert. No cranial nerve deficit. Coordination normal.  Skin: Skin is warm and dry. He is not diaphoretic.    ED Course  Procedures (including critical care  time)  Labs Reviewed  CBC WITH DIFFERENTIAL  COMPREHENSIVE METABOLIC PANEL   No results found.   No diagnosis found.   Date: 05/07/2012  Rate: 70's  Rhythm: normal sinus rhythm  QRS Axis: normal  Intervals: normal  ST/T Wave abnormalities: normal  Conduction Disutrbances:none  Narrative Interpretation:   Old EKG Reviewed: unchanged     MDM  The patient presents here with dizzy episodes for the past week.  The cbc and comp appear unremarkable as does the ekg.  At this point, I feel as though he is stable for discharge.  He has recently had an adjustment in his antihypertensives and I have recommended that he follow his blood pressures at home and follow up with his primary doctor in the next week.  This does not sound like vertigo.          Geoffery Lyons, MD 05/07/12 1647  Geoffery Lyons, MD 05/07/12 [phone]

## 2013-08-31 ENCOUNTER — Emergency Department (HOSPITAL_COMMUNITY): Payer: Medicare Other

## 2013-08-31 ENCOUNTER — Encounter (HOSPITAL_COMMUNITY): Payer: Self-pay | Admitting: Emergency Medicine

## 2013-08-31 ENCOUNTER — Emergency Department (HOSPITAL_COMMUNITY)
Admission: EM | Admit: 2013-08-31 | Discharge: 2013-08-31 | Disposition: A | Discharge status: Home / Self Care | Payer: Medicare Other | Attending: Emergency Medicine | Admitting: Emergency Medicine

## 2013-08-31 DIAGNOSIS — F411 Generalized anxiety disorder: Secondary | ICD-10-CM | POA: Diagnosis not present

## 2013-08-31 DIAGNOSIS — Z79899 Other long term (current) drug therapy: Secondary | ICD-10-CM | POA: Insufficient documentation

## 2013-08-31 DIAGNOSIS — Z87891 Personal history of nicotine dependence: Secondary | ICD-10-CM | POA: Diagnosis not present

## 2013-08-31 DIAGNOSIS — R059 Cough, unspecified: Secondary | ICD-10-CM | POA: Diagnosis present

## 2013-08-31 DIAGNOSIS — J069 Acute upper respiratory infection, unspecified: Secondary | ICD-10-CM | POA: Diagnosis not present

## 2013-08-31 DIAGNOSIS — R05 Cough: Secondary | ICD-10-CM | POA: Diagnosis present

## 2013-08-31 DIAGNOSIS — R110 Nausea: Secondary | ICD-10-CM | POA: Insufficient documentation

## 2013-08-31 DIAGNOSIS — Z87448 Personal history of other diseases of urinary system: Secondary | ICD-10-CM | POA: Insufficient documentation

## 2013-08-31 DIAGNOSIS — I10 Essential (primary) hypertension: Secondary | ICD-10-CM | POA: Diagnosis not present

## 2013-08-31 MED ORDER — PREDNISONE 20 MG PO TABS
60.0000 mg | ORAL_TABLET | Freq: Once | ORAL | Status: AC
  Administered 2013-08-31: 60 mg via ORAL
  Filled 2013-08-31: qty 3

## 2013-08-31 MED ORDER — DEXTROMETHORPHAN POLISTIREX 30 MG/5ML PO LQCR: 15.0000 mg | Freq: Two times a day (BID) | ORAL | Status: DC

## 2013-08-31 MED ORDER — PREDNISONE 20 MG PO TABS: 40.0000 mg | ORAL_TABLET | Freq: Every day | ORAL | Status: DC

## 2013-08-31 NOTE — ED Notes (Signed)
Pt reports nonproductive cough last week that subsided last weekend. Pt reports cough has left him "congested and weak all over." Pt denies pain, CP, or SOB.

## 2013-08-31 NOTE — ED Notes (Signed)
Pt transported to CT ?

## 2013-08-31 NOTE — ED Notes (Signed)
Pt states nausea, congestion cough for 1 week.  Unknown for fever.  No vomiting.

## 2013-08-31 NOTE — ED Provider Notes (Signed)
CSN: [id number]     Arrival date & time 08/31/13  1713 History   First MD Initiated Contact with Patient 08/31/13 1750     Chief Complaint  Patient presents with  . Nausea  . Cough     (Consider location/radiation/quality/duration/timing/severity/associated sxs/prior Treatment) HPI Comments: Patient presents to the emergency department with chief complaint of cough, nasal congestion, and chest congestion times one week. Additionally, he reports decreased appetite, and states that because of this, when he takes his medicines, he feels nauseated. He denies any vomiting. Denies any fevers or chills. Denies productive cough. He has not tried taking anything to alleviate his symptoms. There are no aggravating or alleviating factors. He denies chest pain, shortness of breath, or abdominal pain.  The history is provided by the patient. No language interpreter was used.    Past Medical History  Diagnosis Date  . Hypertension   . Urinary hesitancy   . Hydrocele, right   . Spermatocele   . Anxiety    Past Surgical History  Procedure Laterality Date  . Umbilical hernia repair  1998  . Scrotal exploration  06/20/2011    Procedure: SCROTUM EXPLORATION;  Surgeon: Valetta Fuller, MD;  Location: [facility];  Service: Urology;  Laterality: Right;  . Hydrocele excision  06/20/2011    Procedure: HYDROCELECTOMY ADULT;  Surgeon: Valetta Fuller, MD;  Location: [facility];  Service: Urology;  Laterality: Right;  . Spermatocelectomy  06/20/2011    Procedure: SPERMATOCELECTOMY;  Surgeon: Valetta Fuller, MD;  Location: [facility];  Service: Urology;  Laterality: Right;   History reviewed. No pertinent family history. History  Substance Use Topics  . Smoking status: Former Smoker    Types: Cigarettes    Quit date: 03/28/1958  . Smokeless tobacco: Never Used  . Alcohol Use: Yes     Comment: 1-2 drinks per week    Review of Systems  Constitutional:  Negative for fever and chills.  Respiratory: Positive for cough. Negative for shortness of breath.   Cardiovascular: Negative for chest pain.  Gastrointestinal: Negative for nausea, vomiting, diarrhea and constipation.  Genitourinary: Negative for dysuria.      Allergies  Review of patient's allergies indicates no known allergies.  Home Medications   Prior to Admission medications   Medication Sig Start Date End Date Taking? Authorizing Provider  ibuprofen (ADVIL,MOTRIN) 200 MG tablet Take 400 mg by mouth every 6 (six) hours as needed. pain   Yes Historical Provider, MD  LORazepam (ATIVAN) 1 MG tablet Take 0.5 mg by mouth 2 (two) times daily.    Yes Historical Provider, MD  metoprolol succinate (TOPROL-XL) 25 MG 24 hr tablet Take 25 mg by mouth 2 (two) times daily.   Yes Historical Provider, MD  Multiple Vitamin (MULTIVITAMIN WITH MINERALS) TABS tablet Take 1 tablet by mouth daily.   Yes Historical Provider, MD  olmesartan-hydrochlorothiazide (BENICAR HCT) 40-25 MG per tablet Take 1 tablet by mouth daily.   Yes Historical Provider, MD  Tamsulosin HCl (FLOMAX) 0.4 MG CAPS Take 0.4 mg by mouth daily.   Yes Historical Provider, MD   BP 134/92  Pulse 106  Temp(Src) 99.5 F (37.5 C) (Oral)  Resp 20  SpO2 96% Physical Exam  Nursing note and vitals reviewed. Constitutional: He is oriented to person, place, and time. He appears well-developed and well-nourished.  HENT:  Head: Normocephalic and atraumatic.  Eyes: Conjunctivae and EOM are normal. Pupils are equal, round, and reactive to light. Right eye  exhibits no discharge. Left eye exhibits no discharge. No scleral icterus.  Neck: Normal range of motion. Neck supple. No JVD present.  Cardiovascular: Normal rate, regular rhythm and normal heart sounds.  Exam reveals no gallop and no friction rub.   No murmur heard. Pulmonary/Chest: Effort normal and breath sounds normal. No respiratory distress. He has no wheezes. He has no rales. He  exhibits no tenderness.  Lungs are clear to auscultation  Abdominal: Soft. He exhibits no distension and no mass. There is no tenderness. There is no rebound and no guarding.  Musculoskeletal: Normal range of motion. He exhibits no edema and no tenderness.  Neurological: He is alert and oriented to person, place, and time.  Skin: Skin is warm and dry.  Psychiatric: He has a normal mood and affect. His behavior is normal. Judgment and thought content normal.    ED Course  Procedures (including critical care time) Labs Review Labs Reviewed - No data to display  Imaging Review Dg Chest 2 View  08/31/2013   CLINICAL DATA:  Cough and difficulty breathing  EXAM: CHEST  2 VIEW  COMPARISON:  None.  FINDINGS: There is no edema or consolidation. Heart size and pulmonary vascularity are normal. No adenopathy. No bone lesions.  IMPRESSION: No edema or consolidation.   Electronically Signed   By: Bretta BangWilliam  Woodruff M.D.   On: 08/31/2013 18:12     EKG Interpretation None      MDM   Final diagnoses:  Cough  URI (upper respiratory infection)    Pt CXR negative for acute infiltrate. Patients symptoms are consistent with URI, likely viral etiology. Discussed that antibiotics are not indicated for viral infections. Pt will be discharged with symptomatic treatment.  Verbalizes understanding and is agreeable with plan. Pt is hemodynamically stable & in NAD prior to dc.    Roxy Horsemanobert Mileigh Tilley, PA-C 08/31/13 2258

## 2013-08-31 NOTE — Discharge Instructions (Signed)

## 2013-09-03 NOTE — ED Provider Notes (Signed)
Medical screening examination/treatment/procedure(s) were performed by non-physician practitioner and as supervising physician I was immediately available for consultation/collaboration.   EKG Interpretation None        Candyce Churn III, MD 09/03/13 [phone]

## 2014-05-19 ENCOUNTER — Emergency Department (HOSPITAL_COMMUNITY)
Admission: EM | Admit: 2014-05-19 | Discharge: 2014-05-19 | Disposition: A | Discharge status: Home / Self Care | Payer: Medicare Other | Attending: Emergency Medicine | Admitting: Emergency Medicine

## 2014-05-19 ENCOUNTER — Encounter (HOSPITAL_COMMUNITY): Payer: Self-pay | Admitting: Emergency Medicine

## 2014-05-19 DIAGNOSIS — F419 Anxiety disorder, unspecified: Secondary | ICD-10-CM | POA: Diagnosis not present

## 2014-05-19 DIAGNOSIS — H9201 Otalgia, right ear: Secondary | ICD-10-CM | POA: Insufficient documentation

## 2014-05-19 DIAGNOSIS — J3489 Other specified disorders of nose and nasal sinuses: Secondary | ICD-10-CM | POA: Insufficient documentation

## 2014-05-19 DIAGNOSIS — I10 Essential (primary) hypertension: Secondary | ICD-10-CM | POA: Diagnosis not present

## 2014-05-19 DIAGNOSIS — R0981 Nasal congestion: Secondary | ICD-10-CM | POA: Diagnosis not present

## 2014-05-19 DIAGNOSIS — Z87891 Personal history of nicotine dependence: Secondary | ICD-10-CM | POA: Diagnosis not present

## 2014-05-19 DIAGNOSIS — Z79899 Other long term (current) drug therapy: Secondary | ICD-10-CM | POA: Insufficient documentation

## 2014-05-19 DIAGNOSIS — M542 Cervicalgia: Secondary | ICD-10-CM | POA: Diagnosis not present

## 2014-05-19 DIAGNOSIS — Z7952 Long term (current) use of systemic steroids: Secondary | ICD-10-CM | POA: Diagnosis not present

## 2014-05-19 DIAGNOSIS — Z87448 Personal history of other diseases of urinary system: Secondary | ICD-10-CM | POA: Insufficient documentation

## 2014-05-19 MED ORDER — TRAMADOL HCL 50 MG PO TABS: 50.0000 mg | ORAL_TABLET | Freq: Four times a day (QID) | ORAL | Status: DC | PRN

## 2014-05-19 MED ORDER — GUAIFENESIN 100 MG/5ML PO LIQD
100.0000 mg | ORAL | Status: DC | PRN
Start: 2014-05-19 — End: 2019-05-08

## 2014-05-19 MED ORDER — CYCLOBENZAPRINE HCL 10 MG PO TABS: 10.0000 mg | ORAL_TABLET | Freq: Two times a day (BID) | ORAL | Status: DC | PRN

## 2014-05-19 NOTE — ED Notes (Signed)
Pt c/o right ear pain, states he cannot get appointment until next week.

## 2014-05-19 NOTE — ED Provider Notes (Signed)
CSN: [id number]     Arrival date & time 05/19/14  1627 History  This chart was scribed for Junius Finner, PA-C, working with Juliet Rude. Rubin Payor, MD by Jolene Provost, ED Scribe. This patient was seen in room WTR5/WTR5 and the patient's care was started at 5:50 PM.    Chief Complaint  Patient presents with  . Otalgia    HPI  HPI Comments: Jesus [name] is a 69 y.o. male who presents to the Emergency Department complaining of severe right ear pain with associated neck pain that started three days ago. Pt denies cough. Pt endorses associated nasal congestion. Pt states he has an appointment with his PCP next week, but states he could not make it due to pain. Pt states he has been using nasonex nasal spray and sudafed at night. Pt states he has been taking tylenol and ibuprofen every four hours without relief. Pt states he has had a neck injury in the past and this pain feels similar.   Past Medical History  Diagnosis Date  . Hypertension   . Urinary hesitancy   . Hydrocele, right   . Spermatocele   . Anxiety    Past Surgical History  Procedure Laterality Date  . Umbilical hernia repair  1998  . Scrotal exploration  06/20/2011    Procedure: SCROTUM EXPLORATION;  Surgeon: Valetta Fuller, MD;  Location: [facility];  Service: Urology;  Laterality: Right;  . Hydrocele excision  06/20/2011    Procedure: HYDROCELECTOMY ADULT;  Surgeon: Valetta Fuller, MD;  Location: [facility];  Service: Urology;  Laterality: Right;  . Spermatocelectomy  06/20/2011    Procedure: SPERMATOCELECTOMY;  Surgeon: Valetta Fuller, MD;  Location: [facility];  Service: Urology;  Laterality: Right;   History reviewed. No pertinent family history. History  Substance Use Topics  . Smoking status: Former Smoker    Types: Cigarettes    Quit date: 03/28/1958  . Smokeless tobacco: Never Used  . Alcohol Use: Yes     Comment: 1-2 drinks per week    Review of Systems   Constitutional: Negative for fever and chills.  HENT: Positive for congestion, rhinorrhea and sinus pressure.   Respiratory: Negative for cough.   Gastrointestinal: Negative for nausea and vomiting.  All other systems reviewed and are negative.     Allergies  Review of patient's allergies indicates no known allergies.  Home Medications   Prior to Admission medications   Medication Sig Start Date End Date Taking? Authorizing Provider  ibuprofen (ADVIL,MOTRIN) 200 MG tablet Take 400 mg by mouth every 6 (six) hours as needed. pain   Yes Historical Provider, MD  LORazepam (ATIVAN) 1 MG tablet Take 0.5 mg by mouth 2 (two) times daily.    Yes Historical Provider, MD  metoprolol succinate (TOPROL-XL) 25 MG 24 hr tablet Take 25 mg by mouth 2 (two) times daily.   Yes Historical Provider, MD  Multiple Vitamin (MULTIVITAMIN WITH MINERALS) TABS tablet Take 1 tablet by mouth daily.   Yes Historical Provider, MD  olmesartan-hydrochlorothiazide (BENICAR HCT) 40-25 MG per tablet Take 1 tablet by mouth daily.   Yes Historical Provider, MD  oxymetazoline (AFRIN) 0.05 % nasal spray Place 1 spray into both nostrils 2 (two) times daily as needed for congestion.   Yes Historical Provider, MD  Tamsulosin HCl (FLOMAX) 0.4 MG CAPS Take 0.4 mg by mouth daily.   Yes Historical Provider, MD  cyclobenzaprine (FLEXERIL) 10 MG tablet Take 1 tablet (10 mg total)  by mouth 2 (two) times daily as needed for muscle spasms. 05/19/14   Junius FinnerErin O'Malley, PA-C  dextromethorphan (DELSYM) 30 MG/5ML liquid Take 2.5 mLs (15 mg total) by mouth 2 (two) times daily. Patient not taking: Reported on 05/19/2014 08/31/13   Roxy Horsemanobert Browning, PA-C  guaiFENesin (ROBITUSSIN) 100 MG/5ML liquid Take 5-10 mLs (100-200 mg total) by mouth every 4 (four) hours as needed for cough. 05/19/14   Junius FinnerErin O'Malley, PA-C  predniSONE (DELTASONE) 20 MG tablet Take 2 tablets (40 mg total) by mouth daily. Patient not taking: Reported on 05/19/2014 08/31/13   Roxy Horsemanobert  Browning, PA-C  traMADol (ULTRAM) 50 MG tablet Take 1 tablet (50 mg total) by mouth every 6 (six) hours as needed. 05/19/14   Junius FinnerErin O'Malley, PA-C   BP 148/96 mmHg  Pulse 109  Temp(Src) 98 F (36.7 C) (Oral)  Resp 17  SpO2 99% Physical Exam  Constitutional: He is oriented to person, place, and time. He appears well-developed and well-nourished. No distress.  HENT:  Head: Normocephalic and atraumatic.  Right Ear: Hearing, tympanic membrane, external ear and ear canal normal.  Left Ear: Hearing, tympanic membrane, external ear and ear canal normal.  Nose: Mucosal edema and rhinorrhea present.  Mouth/Throat: Uvula is midline, oropharynx is clear and moist and mucous membranes are normal.  Eyes: Pupils are equal, round, and reactive to light.  Neck: Normal range of motion. Neck supple. Muscular tenderness present.    No nuchal rigidity or meningeal signs.  Cardiovascular: Normal rate.   Pulmonary/Chest: Effort normal. No respiratory distress.  Musculoskeletal: Normal range of motion. He exhibits no edema.  Lymphadenopathy:    He has no cervical adenopathy.  Neurological: He is alert and oriented to person, place, and time. Coordination normal.  Skin: Skin is warm and dry. He is not diaphoretic.  Psychiatric: He has a normal mood and affect. His behavior is normal.  Nursing note and vitals reviewed.   ED Course  Procedures  DIAGNOSTIC STUDIES: Oxygen Saturation is 99% on RA, normal by my interpretation.    COORDINATION OF CARE: 5:56 PM Discussed treatment plan with pt at bedside and pt agreed to plan.  Labs Review Labs Reviewed - No data to display  Imaging Review No results found.   EKG Interpretation None      MDM   Final diagnoses:  Otalgia, right  Nasal congestion  Neck pain on left side    Pt presenting to ED with right sided otalgia. TM-normal, no cerumen impaction of evidence of infection. Nasal mucosa edema.  Tenderness to left cervical muscles.  No  meningeal signs. Will tx for URI symptoms as well as muscles spasms on left side of neck.   I personally performed the services described in this documentation, which was scribed in my presence. The recorded information has been reviewed and is accurate.    Junius Finnerrin O'Malley, PA-C 05/19/14 1803  Juliet RudeNathan R. Rubin PayorPickering, MD 05/20/14 [phone]

## 2014-09-13 ENCOUNTER — Encounter (HOSPITAL_COMMUNITY): Payer: Self-pay | Admitting: *Deleted

## 2014-09-13 ENCOUNTER — Encounter (HOSPITAL_COMMUNITY): Admission: EM | Disposition: A | Discharge status: Home / Self Care | Payer: Self-pay | Source: Home / Self Care

## 2014-09-13 ENCOUNTER — Encounter (HOSPITAL_COMMUNITY): Payer: Self-pay | Admitting: Anesthesiology

## 2014-09-13 ENCOUNTER — Emergency Department (HOSPITAL_COMMUNITY): Payer: Medicare Other

## 2014-09-13 ENCOUNTER — Inpatient Hospital Stay (HOSPITAL_COMMUNITY)
Admission: EM | Admit: 2014-09-13 | Discharge: 2014-09-21 | DRG: 393 | Disposition: A | Discharge status: Home / Self Care | Payer: Medicare Other | Attending: Surgery | Admitting: Surgery

## 2014-09-13 DIAGNOSIS — F419 Anxiety disorder, unspecified: Secondary | ICD-10-CM | POA: Diagnosis present

## 2014-09-13 DIAGNOSIS — R7989 Other specified abnormal findings of blood chemistry: Secondary | ICD-10-CM

## 2014-09-13 DIAGNOSIS — Z6835 Body mass index (BMI) 35.0-35.9, adult: Secondary | ICD-10-CM

## 2014-09-13 DIAGNOSIS — I10 Essential (primary) hypertension: Secondary | ICD-10-CM | POA: Diagnosis present

## 2014-09-13 DIAGNOSIS — Z87891 Personal history of nicotine dependence: Secondary | ICD-10-CM

## 2014-09-13 DIAGNOSIS — K436 Other and unspecified ventral hernia with obstruction, without gangrene: Principal | ICD-10-CM | POA: Diagnosis present

## 2014-09-13 DIAGNOSIS — I2699 Other pulmonary embolism without acute cor pulmonale: Secondary | ICD-10-CM | POA: Diagnosis not present

## 2014-09-13 DIAGNOSIS — E869 Volume depletion, unspecified: Secondary | ICD-10-CM | POA: Diagnosis present

## 2014-09-13 DIAGNOSIS — K56600 Partial intestinal obstruction, unspecified as to cause: Secondary | ICD-10-CM

## 2014-09-13 DIAGNOSIS — R000 Tachycardia, unspecified: Secondary | ICD-10-CM | POA: Diagnosis present

## 2014-09-13 DIAGNOSIS — K420 Umbilical hernia with obstruction, without gangrene: Secondary | ICD-10-CM

## 2014-09-13 DIAGNOSIS — R778 Other specified abnormalities of plasma proteins: Secondary | ICD-10-CM | POA: Diagnosis not present

## 2014-09-13 DIAGNOSIS — R1110 Vomiting, unspecified: Secondary | ICD-10-CM | POA: Diagnosis not present

## 2014-09-13 DIAGNOSIS — K56609 Unspecified intestinal obstruction, unspecified as to partial versus complete obstruction: Secondary | ICD-10-CM | POA: Diagnosis present

## 2014-09-13 DIAGNOSIS — I248 Other forms of acute ischemic heart disease: Secondary | ICD-10-CM | POA: Diagnosis not present

## 2014-09-13 LAB — COMPREHENSIVE METABOLIC PANEL
ALK PHOS: 70 U/L (ref 38–126)
ALT: 25 U/L (ref 17–63)
AST: 28 U/L (ref 15–41)
Albumin: 4.6 g/dL (ref 3.5–5.0)
Anion gap: 11 (ref 5–15)
BILIRUBIN TOTAL: 1.1 mg/dL (ref 0.3–1.2)
BUN: 21 mg/dL — AB (ref 6–20)
CHLORIDE: 102 mmol/L (ref 101–111)
CO2: 28 mmol/L (ref 22–32)
CREATININE: 1.13 mg/dL (ref 0.61–1.24)
Calcium: 10.1 mg/dL (ref 8.9–10.3)
GFR calc Af Amer: >60 mL/min (ref >60)
Glucose, Bld: 145 mg/dL — ABNORMAL HIGH (ref 65–99)
POTASSIUM: 3.9 mmol/L (ref 3.5–5.1)
SODIUM: 141 mmol/L (ref 135–145)
Total Protein: 7.8 g/dL (ref 6.5–8.1)

## 2014-09-13 LAB — CBC
HCT: 48.9 % (ref 39.0–52.0)
HEMOGLOBIN: 16.5 g/dL (ref 13.0–17.0)
MCH: 28.9 pg (ref 26.0–34.0)
MCHC: 33.7 g/dL (ref 30.0–36.0)
MCV: 85.8 fL (ref 78.0–100.0)
Platelets: 194 10*3/uL (ref 150–400)
RBC: 5.7 MIL/uL (ref 4.22–5.81)
RDW: 13.8 % (ref 11.5–15.5)
WBC: 13.5 10*3/uL — ABNORMAL HIGH (ref 4.0–10.5)

## 2014-09-13 LAB — LIPASE, BLOOD: Lipase: 14 U/L — ABNORMAL LOW (ref 22–51)

## 2014-09-13 LAB — GLUCOSE, CAPILLARY: GLUCOSE-CAPILLARY: 137 mg/dL — AB (ref 65–99)

## 2014-09-13 SURGERY — REPAIR, HERNIA, LAPAROSCOPIC, ABDOMINAL APPROACH
Anesthesia: General

## 2014-09-13 MED ORDER — SODIUM CHLORIDE 0.9 % IV BOLUS (SEPSIS)
1000.0000 mL | Freq: Once | INTRAVENOUS | Status: AC
  Administered 2014-09-13: 1000 mL via INTRAVENOUS

## 2014-09-13 MED ORDER — HYDROMORPHONE HCL 1 MG/ML IJ SOLN
1.0000 mg | Freq: Once | INTRAMUSCULAR | Status: AC
  Administered 2014-09-13: 1 mg via INTRAVENOUS
  Filled 2014-09-13: qty 1

## 2014-09-13 MED ORDER — HEPARIN SODIUM (PORCINE) 5000 UNIT/ML IJ SOLN
5000.0000 [IU] | Freq: Three times a day (TID) | INTRAMUSCULAR | Status: DC
  Administered 2014-09-13 – 2014-09-16 (×7): 5000 [IU] via SUBCUTANEOUS
  Filled 2014-09-13 (×11): qty 1

## 2014-09-13 MED ORDER — TAMSULOSIN HCL 0.4 MG PO CAPS
0.4000 mg | ORAL_CAPSULE | Freq: Every day | ORAL | Status: DC
  Administered 2014-09-13 – 2014-09-15 (×3): 0.4 mg via ORAL
  Filled 2014-09-13 (×3): qty 1

## 2014-09-13 MED ORDER — IOHEXOL 300 MG/ML  SOLN
100.0000 mL | Freq: Once | INTRAMUSCULAR | Status: AC | PRN
  Administered 2014-09-13: 100 mL via INTRAVENOUS

## 2014-09-13 MED ORDER — IRBESARTAN 75 MG PO TABS: 37.5000 mg | ORAL_TABLET | Freq: Every day | ORAL | Status: DC

## 2014-09-13 MED ORDER — HYDROCODONE-ACETAMINOPHEN 5-325 MG PO TABS
1.0000 | ORAL_TABLET | ORAL | Status: DC | PRN
  Administered 2014-09-14 – 2014-09-20 (×3): 2 via ORAL
  Administered 2014-09-20 (×2): 1 via ORAL
  Administered 2014-09-20: 2 via ORAL
  Administered 2014-09-20: 1 via ORAL
  Administered 2014-09-21 (×2): 2 via ORAL
  Filled 2014-09-13: qty 1
  Filled 2014-09-13 (×4): qty 2
  Filled 2014-09-13: qty 1
  Filled 2014-09-13 (×3): qty 2

## 2014-09-13 MED ORDER — ONDANSETRON HCL 4 MG/2ML IJ SOLN
4.0000 mg | Freq: Four times a day (QID) | INTRAMUSCULAR | Status: DC | PRN
  Administered 2014-09-13 – 2014-09-17 (×9): 4 mg via INTRAVENOUS
  Filled 2014-09-13 (×9): qty 2

## 2014-09-13 MED ORDER — IRBESARTAN 75 MG PO TABS
37.5000 mg | ORAL_TABLET | Freq: Every day | ORAL | Status: DC
  Administered 2014-09-14 (×2): 37.5 mg via ORAL
  Filled 2014-09-13 (×3): qty 0.5

## 2014-09-13 MED ORDER — FENTANYL CITRATE (PF) 100 MCG/2ML IJ SOLN
50.0000 ug | Freq: Once | INTRAMUSCULAR | Status: AC
  Administered 2014-09-13: 50 ug via INTRAVENOUS
  Filled 2014-09-13: qty 2

## 2014-09-13 MED ORDER — METOPROLOL SUCCINATE ER 25 MG PO TB24
25.0000 mg | ORAL_TABLET | Freq: Two times a day (BID) | ORAL | Status: DC
  Administered 2014-09-13 – 2014-09-14 (×3): 25 mg via ORAL
  Filled 2014-09-13 (×5): qty 1

## 2014-09-13 MED ORDER — ONDANSETRON HCL 4 MG/2ML IJ SOLN
4.0000 mg | Freq: Once | INTRAMUSCULAR | Status: AC
  Administered 2014-09-13: 4 mg via INTRAVENOUS
  Filled 2014-09-13: qty 2

## 2014-09-13 MED ORDER — KCL IN DEXTROSE-NACL 20-5-0.45 MEQ/L-%-% IV SOLN
INTRAVENOUS | Status: DC
  Administered 2014-09-13 – 2014-09-14 (×2): 150 mL/h via INTRAVENOUS
  Administered 2014-09-15 – 2014-09-17 (×4): via INTRAVENOUS
  Filled 2014-09-13 (×11): qty 1000

## 2014-09-13 MED ORDER — IOHEXOL 300 MG/ML  SOLN: 50.0000 mL | Freq: Once | INTRAMUSCULAR | Status: DC | PRN

## 2014-09-13 MED ORDER — MORPHINE SULFATE 2 MG/ML IJ SOLN
1.0000 mg | INTRAMUSCULAR | Status: DC | PRN
  Administered 2014-09-13 – 2014-09-14 (×3): 2 mg via INTRAVENOUS
  Filled 2014-09-13 (×3): qty 1

## 2014-09-13 NOTE — ED Notes (Signed)
Pt in CT.

## 2014-09-13 NOTE — ED Provider Notes (Signed)
CSN: [id number]     Arrival date & time 09/13/14  1134 History   First MD Initiated Contact with Patient 09/13/14 1139     Chief Complaint  Patient presents with  . Vomiting     (Consider location/radiation/quality/duration/timing/severity/associated sxs/prior Treatment) The history is provided by the patient.     Jesus [name] is a 69 yo PMH HTN that p/w with emesis. It started last night after eating a large breakfast and salad. He has had 5 episodes of emesis after this with the last episode about an hour ago. He reported the emesis being food contents and then clear it nature. It has been non bloody. Has history of ventral hernia repair years ago. No history of blood clots or travel.  He was given zofran and 500 cc bolus on the was to ED with improvement of his symptoms.  Denis any fever, chills, night sweats, chest pain, SOB, or abdominal pain. Has some crampiness in his abdomen that is secondary to his repeated emesis.   Past Medical History  Diagnosis Date  . Hypertension   . Urinary hesitancy   . Hydrocele, right   . Spermatocele   . Anxiety    Past Surgical History  Procedure Laterality Date  . Umbilical hernia repair  1998  . Scrotal exploration  06/20/2011    Procedure: SCROTUM EXPLORATION;  Surgeon: Valetta Fuller, MD;  Location: [facility];  Service: Urology;  Laterality: Right;  . Hydrocele excision  06/20/2011    Procedure: HYDROCELECTOMY ADULT;  Surgeon: Valetta Fuller, MD;  Location: [facility]  Service: Urology;  Laterality: Right;  . Spermatocelectomy  06/20/2011    Procedure: SPERMATOCELECTOMY;  Surgeon: Valetta Fuller, MD;  Location: [facility];  Service: Urology;  Laterality: Right;   History reviewed. No pertinent family history. History  Substance Use Topics  . Smoking status: Former Smoker    Types: Cigarettes    Quit date: 03/28/1958  . Smokeless tobacco: Never Used  . Alcohol Use: Yes     Comment: 1-2  drinks per week    Review of Systems  Constitutional: Negative for fever and chills.  Respiratory: Negative for shortness of breath and wheezing.   Cardiovascular: Negative for chest pain.  Gastrointestinal: Positive for vomiting and abdominal pain. Negative for diarrhea and constipation.  Skin: Negative for rash.  Neurological: Negative for weakness.      Allergies  Review of patient's allergies indicates no known allergies.  Home Medications   Prior to Admission medications   Medication Sig Start Date End Date Taking? Authorizing Provider  ibuprofen (ADVIL,MOTRIN) 200 MG tablet Take 800 mg by mouth every 6 (six) hours as needed for moderate pain. pain   Yes Historical Provider, MD  LORazepam (ATIVAN) 1 MG tablet Take 0.5 mg by mouth 2 (two) times daily.    Yes Historical Provider, MD  metoprolol succinate (TOPROL-XL) 25 MG 24 hr tablet Take 25 mg by mouth 2 (two) times daily.   Yes Historical Provider, MD  Multiple Vitamin (MULTIVITAMIN WITH MINERALS) TABS tablet Take 1 tablet by mouth daily.   Yes Historical Provider, MD  oxymetazoline (AFRIN) 0.05 % nasal spray Place 1 spray into both nostrils 2 (two) times daily as needed for congestion.   Yes Historical Provider, MD  Tamsulosin HCl (FLOMAX) 0.4 MG CAPS Take 0.4 mg by mouth daily.   Yes Historical Provider, MD  cyclobenzaprine (FLEXERIL) 10 MG tablet Take 1 tablet (10 mg total) by mouth 2 (two)  times daily as needed for muscle spasms. Patient not taking: Reported on 09/13/2014 05/19/14   Junius Finner, PA-C  dextromethorphan (DELSYM) 30 MG/5ML liquid Take 2.5 mLs (15 mg total) by mouth 2 (two) times daily. Patient not taking: Reported on 05/19/2014 08/31/13   Roxy Horseman, PA-C  guaiFENesin (ROBITUSSIN) 100 MG/5ML liquid Take 5-10 mLs (100-200 mg total) by mouth every 4 (four) hours as needed for cough. Patient not taking: Reported on 09/13/2014 05/19/14   Junius Finner, PA-C  predniSONE (DELTASONE) 20 MG tablet Take 2 tablets (40  mg total) by mouth daily. Patient not taking: Reported on 05/19/2014 08/31/13   Roxy Horseman, PA-C  traMADol (ULTRAM) 50 MG tablet Take 1 tablet (50 mg total) by mouth every 6 (six) hours as needed. Patient not taking: Reported on 09/13/2014 05/19/14   Junius Finner, PA-C   BP 139/79 mmHg  Pulse 123  Temp(Src) 98.3 F (36.8 C) (Oral)  Resp 20  SpO2 92% Physical Exam  Constitutional: He is oriented to person, place, and time. He appears well-developed and well-nourished.  HENT:  Head: Normocephalic and atraumatic.  Eyes: Conjunctivae and EOM are normal.  Neck: Normal range of motion.  Cardiovascular: Regular rhythm, normal heart sounds and intact distal pulses.  Tachycardia present.   No murmur heard. Pulmonary/Chest: Effort normal and breath sounds normal. He has no wheezes. He has no rales.  Abdominal: Soft. Bowel sounds are normal. He exhibits distension. There is no tenderness. There is no rebound and no guarding.  Musculoskeletal: Normal range of motion.  Neurological: He is alert and oriented to person, place, and time.  Skin: Skin is warm. No rash noted.    ED Course  Procedures (including critical care time) Labs Review Labs Reviewed  COMPREHENSIVE METABOLIC PANEL - Abnormal; Notable for the following:    Glucose, Bld 145 (*)    BUN 21 (*)    All other components within normal limits  LIPASE, BLOOD - Abnormal; Notable for the following:    Lipase 14 (*)    All other components within normal limits  CBC - Abnormal; Notable for the following:    WBC 13.5 (*)    All other components within normal limits    Imaging Review Ct Abdomen Pelvis W Contrast  09/13/2014   CLINICAL DATA:  Vomiting, abdominal pain  EXAM: CT ABDOMEN AND PELVIS WITH CONTRAST  TECHNIQUE: Multidetector CT imaging of the abdomen and pelvis was performed using the standard protocol following bolus administration of intravenous contrast.  CONTRAST:  OMNIPAQUE IOHEXOL 300 MG/ML  SOLN  COMPARISON:   None.  FINDINGS: Lower chest: Curvilinear bilateral lower lobe scarring or atelectasis. Streak artifact from the patient's left arm obscures detail.  Hepatobiliary: Liver is unremarkable allowing for motion artifact and streak artifact. Gallbladder are unremarkable.  Pancreas: Normal  Spleen: Normal  Adrenals/Urinary Tract: Numerous bilateral renal cortical cysts are identified, largest right upper renal pole 3.4 cm image 15. No hydroureteronephrosis. Adrenal glands are unremarkable. No radiopaque renal or ureteral calculus. Bladder is unremarkable.  Stomach/Bowel: There is distal small bowel obstruction with the point of transition at the level of the terminal ileum as it traverses a complex fat and bowel containing ventral abdominal wall hernia. Example, image 50 series 2. Small bowel feces sign is evident. There is fat stranding surrounding the loops of bowel within the hernia. The colon is decompressed. Normal appendix, image 58.  Vascular/Lymphatic: No aortic aneurysm.  No lymphadenopathy.  Other: No free air or fluid.  Musculoskeletal: Lower lumbar spine  disc degenerative change. No acute osseous abnormality.  IMPRESSION: Distal small bowel obstruction the transition point at the level of the terminal ileum secondary to incarceration within anterior abdominal wall hernia. Stranding is noted surrounding loops of bowel within the hernia and this does confer a risk of ischemia.   Electronically Signed   By: Christiana Pellant M.D.   On: 09/13/2014 14:52     EKG Interpretation None      Medications  HYDROmorphone (DILAUDID) injection 1 mg (not administered)  sodium chloride 0.9 % bolus 1,000 mL (0 mLs Intravenous Stopped 09/13/14 1252)  fentaNYL (SUBLIMAZE) injection 50 mcg (50 mcg Intravenous Given 09/13/14 1252)  HYDROmorphone (DILAUDID) injection 1 mg (1 mg Intravenous Given 09/13/14 1345)  ondansetron (ZOFRAN) injection 4 mg (4 mg Intravenous Given 09/13/14 1345)  iohexol (OMNIPAQUE) 300 MG/ML solution  100 mL (100 mLs Intravenous Contrast Given 09/13/14 1416)    MDM   Final diagnoses:  None    Jesus [name] is p/w repeated emesis. Hx of ventral hernia years ago. He attributes his emesis to what he had eaten the day before. Abdomen is soft and non tender. Will obtain CMP, lipase and CBC. CT ab/pelvis showing distal small bowel obstruction. Dr. Ezzard Standing has been consulted and will evaluate.    Myra Rude, MD PGY-2, [facility] 09/13/2014, 3:28 PM      Myra Rude, MD 09/13/14 1529  Nelva Nay, MD 09/13/14 [phone]

## 2014-09-13 NOTE — ED Notes (Signed)
This gentleman's wife has been a patient here at [facility] for a few days and he has been with her.  She was being treated for a kidney infection.  He isn't sure if he picked up something here or ate something bad from the cafeteria.  He c/o >10x vomiting over last 24 hours and cramping in abdominal area.  No diarrhea.

## 2014-09-13 NOTE — Anesthesia Preprocedure Evaluation (Deleted)
Anesthesia Evaluation  Patient identified by MRN, date of birth, ID band Patient awake    Reviewed: Allergy & Precautions, H&P , NPO status , Patient's Chart, lab work & pertinent test results, reviewed documented beta blocker date and time   Airway Mallampati: II  TM Distance: >3 FB Neck ROM: full    Dental no notable dental hx. (+) Teeth Intact, Caps   Pulmonary neg pulmonary ROS, former smoker,  breath sounds clear to auscultation  Pulmonary exam normal       Cardiovascular Exercise Tolerance: Good hypertension, Pt. on medications and Pt. on home beta blockers negative cardio ROS  Rhythm:regular Rate:Normal     Neuro/Psych Anxiety negative neurological ROS     GI/Hepatic negative GI ROS, Neg liver ROS,   Endo/Other  Hyperglycemia Obesity  Renal/GU negative Renal ROSElevated BUN  negative genitourinary   Musculoskeletal Incarcerated ventral hernia   Abdominal   Peds  Hematology negative hematology ROS (+)   Anesthesia Other Findings Upper front gold tooth  Reproductive/Obstetrics negative OB ROS                            Anesthesia Physical  Anesthesia Plan  ASA: II  Anesthesia Plan: General   Post-op Pain Management:    Induction: Intravenous, Rapid sequence and Cricoid pressure planned  Airway Management Planned: Oral ETT  Additional Equipment:   Intra-op Plan:   Post-operative Plan: Extubation in OR  Informed Consent: I have reviewed the patients History and Physical, chart, labs and discussed the procedure including the risks, benefits and alternatives for the proposed anesthesia with the patient or authorized representative who has indicated his/her understanding and acceptance.   Dental advisory given  Plan Discussed with: CRNA, Anesthesiologist and Surgeon  Anesthesia Plan Comments:         Anesthesia Quick Evaluation

## 2014-09-13 NOTE — ED Notes (Signed)
MD at bedside - Dr. Ezzard Standing

## 2014-09-13 NOTE — ED Notes (Signed)
Report called to [place], RN 5 Chad.

## 2014-09-13 NOTE — ED Notes (Signed)
MD at bedside. 

## 2014-09-13 NOTE — Consult Note (Signed)
Re:   Jesus [name] DOB:   [birth year] MRN:   [id number]  ASSESSMENT AND PLAN: 1.  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  Ventral hernia  Prior repair in [place] about 20 years ago.  He has had a known recurrence, just decided to leave it alone  The hernia is mainly reducible.    I do not think that the hernia needs emergency surgery tonight.  But I think with evidence of a SBO on CT, he is best kept overnight to make sure that the bowel is open.  I discussed surgery for hernia and that this is probably best accomplished electively.  He agrees with plan.  3.  Morbid obesity  Weight - 250, height - 71 inches (his report)  BMI - 35 4.  HTN   Chief Complaint  Patient presents with  . Vomiting   REFERRING PHYSICIAN: Pcp Not In System  HISTORY OF PRESENT ILLNESS: Jesus [name] is a 69 y.o. (DOB: Oct 08, 1945)  AA  male whose primary care physician is Pcp Not In System (Dr. Clarisa Schools, [facility]) and comes to [facility] ER today for abdominal pain, nausea, and vomiting.  He came to the hospital to pick up his wife, who was admitted for a UTI.  She was going home today.  He started having abdominal pain Friday, 6/17.  He has vomited about 6 times since the pain begain.  His last BM was yesterday. He has never had a colonoscopy.  He had an abdominal wall hernia repaired in [place] about 1996.  He has known the hernia recurred, but has not had anything done about it. He has no stomach, liver, or pancreas disease.  CT scan of abdomen - 09/13/2014 - Distal small bowel obstruction the transition point at the level of the terminal ileum secondary to incarceration within anterior abdominal wall hernia. Stranding is noted surrounding loops of bowel within the hernia and this does confer a risk of ischemia.  Past Medical History  Diagnosis Date  . Hypertension   . Urinary hesitancy   . Hydrocele, right   . Spermatocele   . Anxiety       Past Surgical  History  Procedure Laterality Date  . Umbilical hernia repair  1998  . Scrotal exploration  06/20/2011    Procedure: SCROTUM EXPLORATION;  Surgeon: Valetta Fuller, MD;  Location: [facility];  Service: Urology;  Laterality: Right;  . Hydrocele excision  06/20/2011    Procedure: HYDROCELECTOMY ADULT;  Surgeon: Valetta Fuller, MD;  Location: [facility];  Service: Urology;  Laterality: Right;  . Spermatocelectomy  06/20/2011    Procedure: SPERMATOCELECTOMY;  Surgeon: Valetta Fuller, MD;  Location: [facility];  Service: Urology;  Laterality: Right;      No current facility-administered medications for this encounter.   Current Outpatient Prescriptions  Medication Sig Dispense Refill  . ibuprofen (ADVIL,MOTRIN) 200 MG tablet Take 800 mg by mouth every 6 (six) hours as needed for moderate pain. pain    . LORazepam (ATIVAN) 1 MG tablet Take 0.5 mg by mouth 2 (two) times daily.     . metoprolol succinate (TOPROL-XL) 25 MG 24 hr tablet Take 25 mg by mouth 2 (two) times daily.    . Multiple Vitamin (MULTIVITAMIN WITH MINERALS) TABS tablet Take 1 tablet by mouth daily.    [name] oxymetazoline (AFRIN) 0.05 % nasal spray Place 1 spray into both nostrils 2 (two) times  daily as needed for congestion.    . Tamsulosin HCl (FLOMAX) 0.4 MG CAPS Take 0.4 mg by mouth daily.    . cyclobenzaprine (FLEXERIL) 10 MG tablet Take 1 tablet (10 mg total) by mouth 2 (two) times daily as needed for muscle spasms. (Patient not taking: Reported on 09/13/2014) 10 tablet 0  . dextromethorphan (DELSYM) 30 MG/5ML liquid Take 2.5 mLs (15 mg total) by mouth 2 (two) times daily. (Patient not taking: Reported on 05/19/2014) 89 mL 0  . guaiFENesin (ROBITUSSIN) 100 MG/5ML liquid Take 5-10 mLs (100-200 mg total) by mouth every 4 (four) hours as needed for cough. (Patient not taking: Reported on 09/13/2014) 60 mL 0  . predniSONE (DELTASONE) 20 MG tablet Take 2 tablets (40 mg total) by mouth daily.  (Patient not taking: Reported on 05/19/2014) 10 tablet 0  . traMADol (ULTRAM) 50 MG tablet Take 1 tablet (50 mg total) by mouth every 6 (six) hours as needed. (Patient not taking: Reported on 09/13/2014) 15 tablet 0     No Known Allergies  REVIEW OF SYSTEMS: Skin:  No history of rash.  No history of abnormal moles. Infection:  No history of hepatitis or HIV.  No history of MRSA. Neurologic:  No history of stroke.  No history of seizure.  No history of headaches. Cardiac:  HTN x 20 years.  No history of heart disease. Pulmonary:  Does not smoke cigarettes.  No asthma or bronchitis.  No OSA/CPAP.  Endocrine:  No diabetes. No thyroid disease. Gastrointestinal:  See HPI. Urologic:  Right hydrocele - 06/20/2011 - [place] Musculoskeletal:  No history of joint or back disease. Hematologic:  No bleeding disorder.  No history of anemia.  Not anticoagulated. Psycho-social:  The patient is oriented.   The patient has no obvious psychologic or social impairment to understanding our conversation and plan.  SOCIAL and FAMILY HISTORY: Married. His wife, Jesus [name], is with him.  She was just discharged from the hospital today. He is retired from working in [place], 2002. He has 4 children - 2 in [place], 1 in [place], and 1 in [place] (daughter).  One daughter is a Engineer, civil (consulting).  PHYSICAL EXAM: BP 163/91 mmHg  Pulse 115  Temp(Src) 98.5 F (36.9 C) (Oral)  Resp 20  SpO2 92%  General: Moderately obese AA M who is alert and generally healthy appearing.  HEENT: Normal. Pupils equal. Neck: Supple. No mass.  No thyroid mass. Lymph Nodes:  No supraclavicular or cervical nodes. Lungs: Clear to auscultation and symmetric breath sounds.  Scar over sternum. Heart:  RRR. No murmur or rub. Abdomen: Soft.  Scar at umbilicus.  He has a reducible hernia that spans at least 10 cm.  The defect feels like a swiss cheese defect.  He has no localized mass or tenderness.  BS are present. Rectal: Not done. Extremities:  Good  strength and ROM  in upper and lower extremities. Neurologic:  Grossly intact to motor and sensory function. Psychiatric: Has normal mood and affect. Behavior is normal.   DATA REVIEWED: Epic notes  Ovidio Kin, MD,  [facility] [address] [place]  [address]   [place], [place]    [address] Phone:  [phone] FAX:  [phone]

## 2014-09-14 ENCOUNTER — Encounter (HOSPITAL_COMMUNITY): Payer: Self-pay | Admitting: *Deleted

## 2014-09-14 ENCOUNTER — Observation Stay (HOSPITAL_COMMUNITY): Payer: Medicare Other

## 2014-09-14 LAB — BASIC METABOLIC PANEL
Anion gap: 9 (ref 5–15)
BUN: 20 mg/dL (ref 6–20)
CO2: 29 mmol/L (ref 22–32)
Calcium: 8.7 mg/dL — ABNORMAL LOW (ref 8.9–10.3)
Chloride: 105 mmol/L (ref 101–111)
Creatinine, Ser: 1.04 mg/dL (ref 0.61–1.24)
GFR calc non Af Amer: >60 mL/min (ref >60)
Glucose, Bld: 118 mg/dL — ABNORMAL HIGH (ref 65–99)
POTASSIUM: 4 mmol/L (ref 3.5–5.1)
SODIUM: 143 mmol/L (ref 135–145)

## 2014-09-14 LAB — CBC
HEMATOCRIT: 44.9 % (ref 39.0–52.0)
Hemoglobin: 14.8 g/dL (ref 13.0–17.0)
MCH: 28.8 pg (ref 26.0–34.0)
MCHC: 33 g/dL (ref 30.0–36.0)
MCV: 87.4 fL (ref 78.0–100.0)
PLATELETS: 191 10*3/uL (ref 150–400)
RBC: 5.14 MIL/uL (ref 4.22–5.81)
RDW: 14.1 % (ref 11.5–15.5)
WBC: 4.2 10*3/uL (ref 4.0–10.5)

## 2014-09-14 MED ORDER — ACETAMINOPHEN 325 MG PO TABS: 650.0000 mg | ORAL_TABLET | ORAL | Status: DC | PRN

## 2014-09-14 NOTE — Progress Notes (Signed)
Patient ID: Jesus [name], male   DOB: 04-07-1945, 69 y.o.   MRN: [id number]  General Surgery - [facility]  HD#: 2  Subjective: Patient feeling much improved this AM. Denies nausea or emesis.  Pain in abdomen a "1" out of ten.  Objective: Vital signs in last 24 hours: Temp:  [97.7 F (36.5 C)-99.5 F (37.5 C)] 98 F (36.7 C) (06/19 0453) Pulse Rate:  [96-123] 97 (06/19 0453) Resp:  [18-22] 18 (06/19 0453) BP: (117-163)/(65-101) 131/82 mmHg (06/19 0453) SpO2:  [92 %-96 %] 95 % (06/19 0453) Weight:  [110.269 kg (243 lb 1.6 oz)] 110.269 kg (243 lb 1.6 oz) (06/18 1951) Last BM Date: 09/11/14  Intake/Output from previous day: 06/18 0701 - 06/19 0700 In: 2570 [P.O.:120; I.V.:450; IV Piggyback:2000] Out: 625 [Urine:625] Intake/Output this shift: Total I/O In: -  Out: 75 [Urine:75]  Physical Exam: HEENT - sclerae clear, mucous membranes moist Neck - soft Chest - clear bilaterally Cor - RRR Abdomen - soft, protuberant; hernia soft, minimally tender, reducible Ext - no edema, non-tender Neuro - alert & oriented, no focal deficits  Lab Results:   Recent Labs  09/13/14 1222 09/14/14 0528  WBC 13.5* 4.2  HGB 16.5 14.8  HCT 48.9 44.9  PLT 194 191   BMET  Recent Labs  09/13/14 1222 09/14/14 0528  NA 141 143  K 3.9 4.0  CL 102 105  CO2 28 29  GLUCOSE 145* 118*  BUN 21* 20  CREATININE 1.13 1.04  CALCIUM 10.1 8.7*   PT/INR No results for input(s): LABPROT, INR in the last 72 hours. Comprehensive Metabolic Panel:    Component Value Date/Time   NA 143 09/14/2014 0528   NA 141 09/13/2014 1222   K 4.0 09/14/2014 0528   K 3.9 09/13/2014 1222   CL 105 09/14/2014 0528   CL 102 09/13/2014 1222   CO2 29 09/14/2014 0528   CO2 28 09/13/2014 1222   BUN 20 09/14/2014 0528   BUN 21* 09/13/2014 1222   CREATININE 1.04 09/14/2014 0528   CREATININE 1.13 09/13/2014 1222   GLUCOSE 118* 09/14/2014 0528   GLUCOSE 145* 09/13/2014 1222   CALCIUM 8.7*  09/14/2014 0528   CALCIUM 10.1 09/13/2014 1222   AST 28 09/13/2014 1222   AST 25 05/07/2012 1550   ALT 25 09/13/2014 1222   ALT 22 05/07/2012 1550   ALKPHOS 70 09/13/2014 1222   ALKPHOS 66 05/07/2012 1550   BILITOT 1.1 09/13/2014 1222   BILITOT 0.4 05/07/2012 1550   PROT 7.8 09/13/2014 1222   PROT 7.6 05/07/2012 1550   ALBUMIN 4.6 09/13/2014 1222   ALBUMIN 4.2 05/07/2012 1550    Studies/Results: Ct Abdomen Pelvis W Contrast  09/13/2014   CLINICAL DATA:  Vomiting, abdominal pain  EXAM: CT ABDOMEN AND PELVIS WITH CONTRAST  TECHNIQUE: Multidetector CT imaging of the abdomen and pelvis was performed using the standard protocol following bolus administration of intravenous contrast.  CONTRAST:  OMNIPAQUE IOHEXOL 300 MG/ML  SOLN  COMPARISON:  None.  FINDINGS: Lower chest: Curvilinear bilateral lower lobe scarring or atelectasis. Streak artifact from the patient's left arm obscures detail.  Hepatobiliary: Liver is unremarkable allowing for motion artifact and streak artifact. Gallbladder are unremarkable.  Pancreas: Normal  Spleen: Normal  Adrenals/Urinary Tract: Numerous bilateral renal cortical cysts are identified, largest right upper renal pole 3.4 cm image 15. No hydroureteronephrosis. Adrenal glands are unremarkable. No radiopaque renal or ureteral calculus. Bladder is unremarkable.  Stomach/Bowel: There is distal small bowel obstruction with  the point of transition at the level of the terminal ileum as it traverses a complex fat and bowel containing ventral abdominal wall hernia. Example, image 50 series 2. Small bowel feces sign is evident. There is fat stranding surrounding the loops of bowel within the hernia. The colon is decompressed. Normal appendix, image 58.  Vascular/Lymphatic: No aortic aneurysm.  No lymphadenopathy.  Other: No free air or fluid.  Musculoskeletal: Lower lumbar spine disc degenerative change. No acute osseous abnormality.  IMPRESSION: Distal small bowel obstruction  the transition point at the level of the terminal ileum secondary to incarceration within anterior abdominal wall hernia. Stranding is noted surrounding loops of bowel within the hernia and this does confer a risk of ischemia.   Electronically Signed   By: Christiana Pellant M.D.   On: 09/13/2014 14:52    Anti-infectives: Anti-infectives    None      Assessment & Plans: Small bowel obstruction, ventral incisional hernia  Advance diet  OOB, ambulate in halls  Pain Rx  Decrease IVF rate  Patient requests delay of discharge until tomorrow  Will arrange follow up with Dr. Ezzard Standing for hernia repair in near future at [facility] office  Velora Heckler, MD, [facility] Office: [phone]   Matika Bartell Judie Petit 09/14/2014

## 2014-09-15 DIAGNOSIS — I248 Other forms of acute ischemic heart disease: Secondary | ICD-10-CM | POA: Diagnosis not present

## 2014-09-15 DIAGNOSIS — I471 Supraventricular tachycardia: Secondary | ICD-10-CM | POA: Diagnosis not present

## 2014-09-15 DIAGNOSIS — Z6835 Body mass index (BMI) 35.0-35.9, adult: Secondary | ICD-10-CM | POA: Diagnosis not present

## 2014-09-15 DIAGNOSIS — K5669 Other intestinal obstruction: Secondary | ICD-10-CM | POA: Diagnosis not present

## 2014-09-15 DIAGNOSIS — F419 Anxiety disorder, unspecified: Secondary | ICD-10-CM | POA: Diagnosis present

## 2014-09-15 DIAGNOSIS — J9601 Acute respiratory failure with hypoxia: Secondary | ICD-10-CM | POA: Diagnosis not present

## 2014-09-15 DIAGNOSIS — E869 Volume depletion, unspecified: Secondary | ICD-10-CM | POA: Diagnosis present

## 2014-09-15 DIAGNOSIS — R7989 Other specified abnormal findings of blood chemistry: Secondary | ICD-10-CM | POA: Diagnosis not present

## 2014-09-15 DIAGNOSIS — R000 Tachycardia, unspecified: Secondary | ICD-10-CM | POA: Diagnosis present

## 2014-09-15 DIAGNOSIS — I2699 Other pulmonary embolism without acute cor pulmonale: Secondary | ICD-10-CM | POA: Diagnosis not present

## 2014-09-15 DIAGNOSIS — R1110 Vomiting, unspecified: Secondary | ICD-10-CM | POA: Diagnosis present

## 2014-09-15 DIAGNOSIS — Z87891 Personal history of nicotine dependence: Secondary | ICD-10-CM | POA: Diagnosis not present

## 2014-09-15 DIAGNOSIS — I10 Essential (primary) hypertension: Secondary | ICD-10-CM | POA: Diagnosis not present

## 2014-09-15 DIAGNOSIS — K436 Other and unspecified ventral hernia with obstruction, without gangrene: Secondary | ICD-10-CM | POA: Diagnosis not present

## 2014-09-15 LAB — MRSA PCR SCREENING: MRSA by PCR: NEGATIVE

## 2014-09-15 MED ORDER — METOPROLOL TARTRATE 1 MG/ML IV SOLN
5.0000 mg | Freq: Four times a day (QID) | INTRAVENOUS | Status: DC
  Administered 2014-09-15 (×2): 5 mg via INTRAVENOUS
  Filled 2014-09-15 (×4): qty 5

## 2014-09-15 MED ORDER — MENTHOL 3 MG MT LOZG
1.0000 | LOZENGE | OROMUCOSAL | Status: DC | PRN
  Filled 2014-09-15 (×2): qty 9

## 2014-09-15 MED ORDER — HYDRALAZINE HCL 20 MG/ML IJ SOLN
20.0000 mg | INTRAMUSCULAR | Status: DC | PRN
  Administered 2014-09-15 – 2014-09-17 (×6): 20 mg via INTRAVENOUS
  Filled 2014-09-15 (×7): qty 1

## 2014-09-15 MED ORDER — MORPHINE SULFATE 2 MG/ML IJ SOLN
1.0000 mg | INTRAMUSCULAR | Status: DC | PRN
  Administered 2014-09-15 (×3): 2 mg via INTRAVENOUS
  Administered 2014-09-16: 4 mg via INTRAVENOUS
  Administered 2014-09-16: 2 mg via INTRAVENOUS
  Administered 2014-09-16 (×5): 4 mg via INTRAVENOUS
  Administered 2014-09-17 – 2014-09-18 (×5): 2 mg via INTRAVENOUS
  Administered 2014-09-19 – 2014-09-20 (×4): 4 mg via INTRAVENOUS
  Administered 2014-09-20 (×3): 2 mg via INTRAVENOUS
  Administered 2014-09-20 – 2014-09-21 (×2): 4 mg via INTRAVENOUS
  Filled 2014-09-15: qty 2
  Filled 2014-09-15: qty 1
  Filled 2014-09-15 (×8): qty 2
  Filled 2014-09-15: qty 1
  Filled 2014-09-15: qty 2
  Filled 2014-09-15 (×5): qty 1
  Filled 2014-09-15: qty 2
  Filled 2014-09-15 (×2): qty 1
  Filled 2014-09-15: qty 2
  Filled 2014-09-15: qty 1
  Filled 2014-09-15: qty 2
  Filled 2014-09-15: qty 1

## 2014-09-15 MED ORDER — ALUM & MAG HYDROXIDE-SIMETH 200-200-20 MG/5ML PO SUSP
30.0000 mL | Freq: Once | ORAL | Status: AC
  Administered 2014-09-15: 30 mL via ORAL
  Filled 2014-09-15: qty 30

## 2014-09-15 MED ORDER — LORAZEPAM 2 MG/ML IJ SOLN
1.0000 mg | Freq: Every day | INTRAMUSCULAR | Status: DC
  Administered 2014-09-15 – 2014-09-20 (×6): 1 mg via INTRAVENOUS
  Filled 2014-09-15 (×6): qty 1

## 2014-09-15 MED ORDER — PHENOL 1.4 % MT LIQD
1.0000 | OROMUCOSAL | Status: DC | PRN
  Administered 2014-09-17: 1 via OROMUCOSAL
  Filled 2014-09-15 (×3): qty 177

## 2014-09-15 MED ORDER — LORAZEPAM 1 MG PO TABS
1.0000 mg | ORAL_TABLET | Freq: Once | ORAL | Status: AC
  Administered 2014-09-15: 1 mg via ORAL
  Filled 2014-09-15: qty 1

## 2014-09-15 MED ORDER — METOPROLOL TARTRATE 1 MG/ML IV SOLN
10.0000 mg | Freq: Four times a day (QID) | INTRAVENOUS | Status: DC
  Administered 2014-09-15: 5 mg via INTRAVENOUS
  Administered 2014-09-16 – 2014-09-17 (×5): 10 mg via INTRAVENOUS
  Filled 2014-09-15 (×13): qty 10

## 2014-09-15 NOTE — Progress Notes (Signed)
Patient ID: Jesus [name], male   DOB: 02-05-1946, 69 y.o.   MRN: [id number] 2 Days Post-Op  Subjective: Pt c/o nausea and intermittent vomiting especially if he tries to take clears.  Had a BM yesterday, but no flatus since then.  Objective: Vital signs in last 24 hours: Temp:  [98.6 F (37 C)-98.8 F (37.1 C)] 98.6 F (37 C) (06/20 0615) Pulse Rate:  [93-112] 93 (06/20 0615) Resp:  [18-20] 18 (06/20 0615) BP: (130-175)/(80-113) 160/80 mmHg (06/20 0742) SpO2:  [96 %-98 %] 97 % (06/20 0615) Last BM Date: 09/13/14  Intake/Output from previous day: 06/19 0701 - 06/20 0700 In: 2441.7 [P.O.:480; I.V.:1961.7] Out: 975 [Urine:675; Emesis/NG output:300] Intake/Output this shift: Total I/O In: 0  Out: 225 [Urine:225]  PE: Abd: soft, but feels distended, umbilical hernia is only partially reducible right now, hypoactive BS Heart: regular Lungs: CTAB  Lab Results:   Recent Labs  09/13/14 1222 09/14/14 0528  WBC 13.5* 4.2  HGB 16.5 14.8  HCT 48.9 44.9  PLT 194 191   BMET  Recent Labs  09/13/14 1222 09/14/14 0528  NA 141 143  K 3.9 4.0  CL 102 105  CO2 28 29  GLUCOSE 145* 118*  BUN 21* 20  CREATININE 1.13 1.04  CALCIUM 10.1 8.7*   PT/INR No results for input(s): LABPROT, INR in the last 72 hours. CMP     Component Value Date/Time   NA 143 09/14/2014 0528   K 4.0 09/14/2014 0528   CL 105 09/14/2014 0528   CO2 29 09/14/2014 0528   GLUCOSE 118* 09/14/2014 0528   BUN 20 09/14/2014 0528   CREATININE 1.04 09/14/2014 0528   CALCIUM 8.7* 09/14/2014 0528   PROT 7.8 09/13/2014 1222   ALBUMIN 4.6 09/13/2014 1222   AST 28 09/13/2014 1222   ALT 25 09/13/2014 1222   ALKPHOS 70 09/13/2014 1222   BILITOT 1.1 09/13/2014 1222   GFRNONAA >60 09/14/2014 0528   GFRAA >60 09/14/2014 0528   Lipase     Component Value Date/Time   LIPASE 14* 09/13/2014 1222       Studies/Results: Ct Abdomen Pelvis W Contrast  09/13/2014   CLINICAL DATA:  Vomiting, abdominal pain   EXAM: CT ABDOMEN AND PELVIS WITH CONTRAST  TECHNIQUE: Multidetector CT imaging of the abdomen and pelvis was performed using the standard protocol following bolus administration of intravenous contrast.  CONTRAST:  OMNIPAQUE IOHEXOL 300 MG/ML  SOLN  COMPARISON:  None.  FINDINGS: Lower chest: Curvilinear bilateral lower lobe scarring or atelectasis. Streak artifact from the patient's left arm obscures detail.  Hepatobiliary: Liver is unremarkable allowing for motion artifact and streak artifact. Gallbladder are unremarkable.  Pancreas: Normal  Spleen: Normal  Adrenals/Urinary Tract: Numerous bilateral renal cortical cysts are identified, largest right upper renal pole 3.4 cm image 15. No hydroureteronephrosis. Adrenal glands are unremarkable. No radiopaque renal or ureteral calculus. Bladder is unremarkable.  Stomach/Bowel: There is distal small bowel obstruction with the point of transition at the level of the terminal ileum as it traverses a complex fat and bowel containing ventral abdominal wall hernia. Example, image 50 series 2. Small bowel feces sign is evident. There is fat stranding surrounding the loops of bowel within the hernia. The colon is decompressed. Normal appendix, image 58.  Vascular/Lymphatic: No aortic aneurysm.  No lymphadenopathy.  Other: No free air or fluid.  Musculoskeletal: Lower lumbar spine disc degenerative change. No acute osseous abnormality.  IMPRESSION: Distal small bowel obstruction the transition point at the level of  the terminal ileum secondary to incarceration within anterior abdominal wall hernia. Stranding is noted surrounding loops of bowel within the hernia and this does confer a risk of ischemia.   Electronically Signed   By: Christiana Pellant M.D.   On: 09/13/2014 14:52   Dg Abd 2 Views  09/14/2014   CLINICAL DATA:  Abdominal distention, small bowel obstruction. Recurrent small bowel obstructions secondary to hernia repair  EXAM: ABDOMEN - 2 VIEW  COMPARISON:   09/13/2014 CT  FINDINGS: There multiple dilated loopsdilated loops of small bowel measuring up to 5 cm. The number of loops of dilated small bowel appears increased from CT topogram 1 day prior. On the upright exam there are multiple short air-fluid levels within the small bowel. There is minimal gas within the colon. Small amount a gas in the rectum.  IMPRESSION: 1. No improvement in small bowel obstruction pattern. 2. Small  gas within the rectum. 3. No free air.   Electronically Signed   By: Genevive Bi M.D.   On: 09/14/2014 10:16    Anti-infectives: Anti-infectives    None       Assessment/Plan  1. Incarcerated umbilical hernia with at least partial small bowel obstruction -patient is intermittently vomiting and nauseated.  D/w Dr. Abbey Chatters.  Will place NGT for decompression.  If we can reduce this, then hopefully it will give [place] a better chance at repairing this laparoscopically if able.   -NPO, x ice chips once NGT placed 2. HTN -convert to IV from po 3. DVT proph -heparin/SCDs      Hartford Maulden E 09/15/2014, 10:15 AM Pager: [phone]

## 2014-09-15 NOTE — Progress Notes (Addendum)
Awake c/o indigestion requesting maalox,Bp 175/106 pulse 98 c/o mid sternal chest pain, no acute distress observed  Dr Gerrit Friends called orders received. Linward Headland D

## 2014-09-15 NOTE — Progress Notes (Signed)
Emesis x 1 this evening 300 ml bile colored,has now reduced po intake until nausea subsides,zofran IV given.Sticky note requesting home med of Ativan done on 6/19.pt now states that he feels anxious,Dr Gerkin called and order for x 1 received.Early  Am on 6/19  Pt was anxious stating he was dealing with some personal issues.has been quick tempered with staff this evening and on call bell frequently for multiple various requests.Also states he will speak with surgeon in the am about going ahead and having hernia repair done on thias admission. Jesus [name] D

## 2014-09-16 ENCOUNTER — Inpatient Hospital Stay (HOSPITAL_COMMUNITY): Payer: Medicare Other

## 2014-09-16 MED ORDER — HEPARIN SODIUM (PORCINE) 5000 UNIT/ML IJ SOLN
5000.0000 [IU] | Freq: Three times a day (TID) | INTRAMUSCULAR | Status: AC
  Administered 2014-09-16: 5000 [IU] via SUBCUTANEOUS
  Filled 2014-09-16 (×2): qty 1

## 2014-09-16 MED ORDER — DEXTROSE 5 % IV SOLN: 2.0000 g | INTRAVENOUS | Status: DC

## 2014-09-16 MED ORDER — HEPARIN SODIUM (PORCINE) 5000 UNIT/ML IJ SOLN
5000.0000 [IU] | Freq: Three times a day (TID) | INTRAMUSCULAR | Status: DC
  Filled 2014-09-16: qty 1

## 2014-09-16 MED ORDER — CEFAZOLIN SODIUM-DEXTROSE 2-3 GM-% IV SOLR: 2.0000 g | Freq: Once | INTRAVENOUS | Status: DC

## 2014-09-16 NOTE — Progress Notes (Signed)
NG tube advanced per PA request. Position check after advancement, tube noted to be in place. Patient tolerated well, stated his nose was very sore. Will continue to monitor patient. C.Collyn Ribas,RN

## 2014-09-16 NOTE — Progress Notes (Signed)
Patient ID: Jesus [name], male   DOB: Aug 17, 1945, 69 y.o.   MRN: [id number] 3 Days Post-Op  Subjective: Pt still with some pain, but nausea is better with NGT in place.  Objective: Vital signs in last 24 hours: Temp:  [98.7 F (37.1 C)-99.9 F (37.7 C)] 99.9 F (37.7 C) Oct 01, 2022 0543) Pulse Rate:  [89-90] 89 10/01/22 0543) Resp:  [16-18] 18 10/01/22 0543) BP: (130-176)/(79-121) 152/92 mmHg Oct 01, 2022 0543) SpO2:  [94 %-96 %] 96 % 2022/10/01 0543) Last BM Date: 09/13/14  Intake/Output from previous day: 06/20 0701 - October 01, 2022 0700 In: 3770 [P.O.:270; I.V.:2500; NG/GT:1000] Out: 1500 [Urine:1100; Emesis/NG output:400] Intake/Output this shift: Total I/O In: -  Out: 300 [Urine:300]  PE: Abd: softer than yesterday, hernia is soft and reducible, few BS, NGT with bilious output Heart: regular Lungs: CTAB  Lab Results:   Recent Labs  09/13/14 1222 09/14/14 0528  WBC 13.5* 4.2  HGB 16.5 14.8  HCT 48.9 44.9  PLT 194 191   BMET  Recent Labs  09/13/14 1222 09/14/14 0528  NA 141 143  K 3.9 4.0  CL 102 105  CO2 28 29  GLUCOSE 145* 118*  BUN 21* 20  CREATININE 1.13 1.04  CALCIUM 10.1 8.7*   PT/INR No results for input(s): LABPROT, INR in the last 72 hours. CMP     Component Value Date/Time   NA 143 09/14/2014 0528   K 4.0 09/14/2014 0528   CL 105 09/14/2014 0528   CO2 29 09/14/2014 0528   GLUCOSE 118* 09/14/2014 0528   BUN 20 09/14/2014 0528   CREATININE 1.04 09/14/2014 0528   CALCIUM 8.7* 09/14/2014 0528   PROT 7.8 09/13/2014 1222   ALBUMIN 4.6 09/13/2014 1222   AST 28 09/13/2014 1222   ALT 25 09/13/2014 1222   ALKPHOS 70 09/13/2014 1222   BILITOT 1.1 09/13/2014 1222   GFRNONAA >60 09/14/2014 0528   GFRAA >60 09/14/2014 0528   Lipase     Component Value Date/Time   LIPASE 14* 09/13/2014 1222       Studies/Results: Dg Abd 2 Views  2014/10/01   CLINICAL DATA:  Partial small bowel obstruction.  Distended abdomen.  EXAM: ABDOMEN - 2 VIEW  COMPARISON:  09/14/2014   FINDINGS: Interval placement of enteric tube with tip just below the expected region of the gastroesophageal junction as this could be advanced 8-10 cm.  Continued evidence of multiple air-filled dilated small bowel loops with differential air-fluid levels. Small bowel measures up to 5.7 cm in diameter. Appearance slightly worse overall compared to the previous exam. Minimal air throughout the colon. No definite free peritoneal air. Remainder of the exam is unchanged.  IMPRESSION: Continued evidence of small bowel obstruction with slight overall interval worsening.  Enteric tube with tip just below the expected region of the gastroesophageal junction. This could be advanced another 8-10 cm.   Electronically Signed   By: Elberta Fortis M.D.   On: October 01, 2014 09:48    Anti-infectives: Anti-infectives    None       Assessment/Plan  1. Partial SBO, possibly secondary to umbilical hernia -hernia is soft and reducible.  Question if he has some scar tissue that may be causing his obstruction or if he is intermittently obstructing as this hernia comes out when he stands and moves around and only reduces with assistance when he lays down. -to OR either later today or tomorrow.  Will defer to Dr. Abbey Chatters -cont NGT for decompression   LOS: 1 day    Lygia Olaes  E 09/16/2014, 10:45 AM Pager: [phone]

## 2014-09-17 ENCOUNTER — Encounter (HOSPITAL_COMMUNITY): Payer: Self-pay | Admitting: Radiology

## 2014-09-17 ENCOUNTER — Encounter (HOSPITAL_COMMUNITY): Admission: EM | Disposition: A | Discharge status: Home / Self Care | Payer: Self-pay | Source: Home / Self Care

## 2014-09-17 ENCOUNTER — Ambulatory Visit (HOSPITAL_COMMUNITY): Payer: Medicare Other

## 2014-09-17 DIAGNOSIS — I2699 Other pulmonary embolism without acute cor pulmonale: Secondary | ICD-10-CM

## 2014-09-17 DIAGNOSIS — I10 Essential (primary) hypertension: Secondary | ICD-10-CM

## 2014-09-17 DIAGNOSIS — Z86711 Personal history of pulmonary embolism: Secondary | ICD-10-CM

## 2014-09-17 DIAGNOSIS — I471 Supraventricular tachycardia: Secondary | ICD-10-CM

## 2014-09-17 DIAGNOSIS — R7989 Other specified abnormal findings of blood chemistry: Secondary | ICD-10-CM

## 2014-09-17 DIAGNOSIS — R000 Tachycardia, unspecified: Secondary | ICD-10-CM | POA: Diagnosis not present

## 2014-09-17 DIAGNOSIS — J9601 Acute respiratory failure with hypoxia: Secondary | ICD-10-CM

## 2014-09-17 DIAGNOSIS — R778 Other specified abnormalities of plasma proteins: Secondary | ICD-10-CM | POA: Diagnosis not present

## 2014-09-17 HISTORY — DX: Personal history of pulmonary embolism: Z86.711

## 2014-09-17 HISTORY — PX: TRANSTHORACIC ECHOCARDIOGRAM: SHX275

## 2014-09-17 LAB — BASIC METABOLIC PANEL
ANION GAP: 6 (ref 5–15)
BUN: 14 mg/dL (ref 6–20)
CHLORIDE: 101 mmol/L (ref 101–111)
CO2: 26 mmol/L (ref 22–32)
CREATININE: 1.18 mg/dL (ref 0.61–1.24)
Calcium: 8.8 mg/dL — ABNORMAL LOW (ref 8.9–10.3)
GFR calc non Af Amer: >60 mL/min (ref >60)
Glucose, Bld: 111 mg/dL — ABNORMAL HIGH (ref 65–99)
Potassium: 3.5 mmol/L (ref 3.5–5.1)
Sodium: 133 mmol/L — ABNORMAL LOW (ref 135–145)

## 2014-09-17 LAB — APTT: aPTT: 31 seconds (ref 24–37)

## 2014-09-17 LAB — BLOOD GAS, ARTERIAL
ACID-BASE DEFICIT: 1.4 mmol/L (ref 0.0–2.0)
Bicarbonate: 20.6 mEq/L (ref 20.0–24.0)
DRAWN BY: 301361
O2 CONTENT: 3 L/min
O2 Saturation: 95.4 %
PCO2 ART: 29.1 mmHg — AB (ref 35.0–45.0)
Patient temperature: 98.6
TCO2: 17.2 mmol/L (ref 0–100)
pH, Arterial: 7.463 — ABNORMAL HIGH (ref 7.350–7.450)
pO2, Arterial: 78.2 mmHg — ABNORMAL LOW (ref 80.0–100.0)

## 2014-09-17 LAB — CBC
HCT: 47.2 % (ref 39.0–52.0)
Hemoglobin: 16 g/dL (ref 13.0–17.0)
MCH: 29.1 pg (ref 26.0–34.0)
MCHC: 33.9 g/dL (ref 30.0–36.0)
MCV: 85.8 fL (ref 78.0–100.0)
Platelets: 159 10*3/uL (ref 150–400)
RBC: 5.5 MIL/uL (ref 4.22–5.81)
RDW: 13.5 % (ref 11.5–15.5)
WBC: 9.4 10*3/uL (ref 4.0–10.5)

## 2014-09-17 LAB — TROPONIN I
Troponin I: 0.2 ng/mL — ABNORMAL HIGH (ref <0.031)
Troponin I: 0.14 ng/mL — ABNORMAL HIGH (ref <0.031)

## 2014-09-17 LAB — TYPE AND SCREEN
ABO/RH(D): A POS
Antibody Screen: NEGATIVE

## 2014-09-17 LAB — ANTITHROMBIN III: AntiThromb III Func: 101 % (ref 75–120)

## 2014-09-17 LAB — HEPARIN LEVEL (UNFRACTIONATED): Heparin Unfractionated: 0.63 IU/mL (ref 0.30–0.70)

## 2014-09-17 LAB — MAGNESIUM: Magnesium: 2 mg/dL (ref 1.7–2.4)

## 2014-09-17 LAB — PROTIME-INR
INR: 1.12 (ref 0.00–1.49)
PROTHROMBIN TIME: 14.6 s (ref 11.6–15.2)

## 2014-09-17 LAB — ABO/RH: ABO/RH(D): A POS

## 2014-09-17 SURGERY — REPAIR, HERNIA, INCISIONAL, LAPAROSCOPIC
Anesthesia: General

## 2014-09-17 MED ORDER — DILTIAZEM HCL 25 MG/5ML IV SOLN
10.0000 mg | Freq: Four times a day (QID) | INTRAVENOUS | Status: DC | PRN
  Administered 2014-09-17: 10 mg via INTRAVENOUS
  Filled 2014-09-17 (×2): qty 5

## 2014-09-17 MED ORDER — METOPROLOL TARTRATE 1 MG/ML IV SOLN
10.0000 mg | Freq: Four times a day (QID) | INTRAVENOUS | Status: DC
  Administered 2014-09-17 – 2014-09-18 (×4): 10 mg via INTRAVENOUS
  Filled 2014-09-17 (×4): qty 10

## 2014-09-17 MED ORDER — HYDROMORPHONE HCL 1 MG/ML IJ SOLN: 0.2500 mg | INTRAMUSCULAR | Status: DC | PRN

## 2014-09-17 MED ORDER — IOHEXOL 350 MG/ML SOLN
100.0000 mL | Freq: Once | INTRAVENOUS | Status: AC | PRN
  Administered 2014-09-17: 100 mL via INTRAVENOUS

## 2014-09-17 MED ORDER — PROMETHAZINE HCL 25 MG/ML IJ SOLN: 6.2500 mg | INTRAMUSCULAR | Status: DC | PRN

## 2014-09-17 MED ORDER — HEPARIN (PORCINE) IN NACL 100-0.45 UNIT/ML-% IJ SOLN
1650.0000 [IU]/h | INTRAMUSCULAR | Status: DC
  Administered 2014-09-17 – 2014-09-19 (×5): 1650 [IU]/h via INTRAVENOUS
  Filled 2014-09-17 (×4): qty 250

## 2014-09-17 MED ORDER — MEPERIDINE HCL 25 MG/ML IJ SOLN: 6.2500 mg | INTRAMUSCULAR | Status: DC | PRN

## 2014-09-17 MED ORDER — METOPROLOL TARTRATE 1 MG/ML IV SOLN
5.0000 mg | Freq: Once | INTRAVENOUS | Status: AC
  Administered 2014-09-17: 5 mg via INTRAVENOUS
  Filled 2014-09-17: qty 5

## 2014-09-17 MED ORDER — LABETALOL HCL 5 MG/ML IV SOLN: 20.0000 mg | Freq: Once | INTRAVENOUS | Status: DC

## 2014-09-17 MED ORDER — FUROSEMIDE 10 MG/ML IJ SOLN
40.0000 mg | Freq: Once | INTRAMUSCULAR | Status: AC
  Administered 2014-09-17: 40 mg via INTRAVENOUS
  Filled 2014-09-17: qty 4

## 2014-09-17 MED ORDER — LABETALOL HCL 5 MG/ML IV SOLN
10.0000 mg | Freq: Once | INTRAVENOUS | Status: AC
Start: 2014-09-17 — End: 2014-09-17
  Administered 2014-09-17: 10 mg via INTRAVENOUS
  Filled 2014-09-17: qty 4

## 2014-09-17 MED ORDER — SODIUM CHLORIDE 0.9 % IV BOLUS (SEPSIS)
1000.0000 mL | INTRAVENOUS | Status: DC | PRN
  Administered 2014-09-17: 1000 mL via INTRAVENOUS
  Filled 2014-09-17: qty 1000

## 2014-09-17 MED ORDER — HEPARIN BOLUS VIA INFUSION
3000.0000 [IU] | Freq: Once | INTRAVENOUS | Status: AC
  Administered 2014-09-17: 3000 [IU] via INTRAVENOUS
  Filled 2014-09-17: qty 3000

## 2014-09-17 NOTE — Progress Notes (Signed)
VASCULAR LAB PRELIMINARY  PRELIMINARY  PRELIMINARY  PRELIMINARY  Bilateral lower extremity venous duplex completed.    Preliminary report:  Bilateral:  No evidence of DVT, superficial thrombosis, or Baker's Cyst.   Kymberly Blomberg, RVS 09/17/2014, 1:55 PM

## 2014-09-17 NOTE — Progress Notes (Signed)
Patient found to have small to medium sized RUL and RLL PE . Started on IV heparin drip. Ordered hypercoagulable w/up . dopper negative for DVT.  BP uncontrolled and remained tachycardic all day.  Patient not in distress. sats maintained in high 90s on 2L. ABG with mild hypoxemia. Received several rounds of antihypertensives and rate limiting agents during the day. 2D echo with normal EF and no wall motion abnormality. No RV strain. Elevated troponin noted. Possibly demand ischemia. Ordered IV NS bolus.  will continue current antihypertensive regimen. ( prn hydralazine and scheduled IV lopressor) Follow serial troponins. Cardiology consulted. Will see in am.  continue stepdown monitoring  -

## 2014-09-17 NOTE — Progress Notes (Signed)
CTA reveals 2 PEs in right lung.  These are moderate size.  Hypercoagulable panel ordered along with doppler's of his lower extremities.  Heparin will be started after hypercoagulable panel drawn as well.  Surgery will be delayed indefinitely at this time given his acute PEs.  His hernia is soft and reducible.  If this partial obstruction is secondary to adhesive disease, hopefully now this will resolve with conservative management as he will not be stable for OR.  Jesus [name] E 12:58 PM 09/17/2014

## 2014-09-17 NOTE — Progress Notes (Signed)
ANTICOAGULATION CONSULT NOTE - Initial Consult  Pharmacy Consult for Heparin Indication: double PE  No Known Allergies  Patient Measurements: Height: 5\' 11"  (180.3 cm) Weight: 241 lb 10 oz (109.6 kg) IBW/kg (Calculated) : 75.3 Heparin Dosing Weight: 99 kg  Vital Signs: Temp: 98.1 F (36.7 C) (06/22 0539) Temp Source: Oral (06/22 0539) BP: 170/116 mmHg (06/22 1250) Pulse Rate: 141 (06/22 0902)  Labs:  Recent Labs  09/17/14 0800  HGB 16.0  HCT 47.2  PLT 159  CREATININE 1.18    Estimated Creatinine Clearance: 77.5 mL/min (by C-G formula based on Cr of 1.18).   Medical History: Past Medical History  Diagnosis Date  . Hypertension   . Urinary hesitancy   . Hydrocele, right   . Spermatocele   . Anxiety     Medications:  Prescriptions prior to admission  Medication Sig Dispense Refill Last Dose  . ibuprofen (ADVIL,MOTRIN) 200 MG tablet Take 800 mg by mouth every 6 (six) hours as needed for moderate pain. pain   09/12/2014 at Unknown time  . LORazepam (ATIVAN) 1 MG tablet Take 0.5 mg by mouth 2 (two) times daily.    09/11/2014  . metoprolol succinate (TOPROL-XL) 25 MG 24 hr tablet Take 25 mg by mouth 2 (two) times daily.   09/12/2014 at 1000  . Multiple Vitamin (MULTIVITAMIN WITH MINERALS) TABS tablet Take 1 tablet by mouth daily.   09/11/2014  . oxymetazoline (AFRIN) 0.05 % nasal spray Place 1 spray into both nostrils 2 (two) times daily as needed for congestion.   unknown  . Tamsulosin HCl (FLOMAX) 0.4 MG CAPS Take 0.4 mg by mouth daily.   09/11/2014  . cyclobenzaprine (FLEXERIL) 10 MG tablet Take 1 tablet (10 mg total) by mouth 2 (two) times daily as needed for muscle spasms. (Patient not taking: Reported on 09/13/2014) 10 tablet 0   . dextromethorphan (DELSYM) 30 MG/5ML liquid Take 2.5 mLs (15 mg total) by mouth 2 (two) times daily. (Patient not taking: Reported on 05/19/2014) 89 mL 0 Completed Course at Unknown time  . guaiFENesin (ROBITUSSIN) 100 MG/5ML liquid Take 5-10  mLs (100-200 mg total) by mouth every 4 (four) hours as needed for cough. (Patient not taking: Reported on 09/13/2014) 60 mL 0   . predniSONE (DELTASONE) 20 MG tablet Take 2 tablets (40 mg total) by mouth daily. (Patient not taking: Reported on 05/19/2014) 10 tablet 0 Completed Course at Unknown time  . traMADol (ULTRAM) 50 MG tablet Take 1 tablet (50 mg total) by mouth every 6 (six) hours as needed. (Patient not taking: Reported on 09/13/2014) 15 tablet 0    Scheduled:  .  ceFAZolin (ANCEF) IV  2 g Intravenous Once  . [START ON 09/18/2014] heparin  5,000 Units Subcutaneous 3 times per day  . labetalol  20 mg Intravenous Once  . LORazepam  1 mg Intravenous QHS   Infusions:    Assessment: 21 yoM with PMH obesity, HTN, admitted on 6/18 for an incarcerated umbilical hernia with partial small bowel obstruction. NG tube placed on 6/20 for decompression and being managed conservatively. Noted with hypoxia overnight 6/21, uncontrolled HTN and tachycardia 6/22 AM. CT reveals double PE in RL.  Patient has been on VTE ppx with SQ heparin since admission; appears unprovoked.  To begin IV per pharmacy once coagulation panel drawn   Baseline INR, aPTT: wnl  Prior anticoagulation: none PTA; last SQ heparin yesterday at 2100   CrCl 78 ml/min  Significant events:  Today, 09/17/2014:  CBC: wnl, plt decr  from admission  Goal of Therapy: Heparin level 0.3-0.7 units/ml Monitor platelets by anticoagulation protocol: Yes  Plan:  Heparin 3000 units IV bolus x 1  Heparin 1650 units/hr IV infusion  Check heparin level 6 hrs after start  Daily CBC, daily heparin level once stable  Monitor for signs of bleeding or thrombosis   Bernadene Person, PharmD Pager: [phone] 09/17/2014, 1:36 PM

## 2014-09-17 NOTE — Progress Notes (Signed)
Echocardiogram 2D Echocardiogram has been performed.  Tye Savoy 09/17/2014, 4:34 PM

## 2014-09-17 NOTE — Progress Notes (Signed)
Patient ID: Jesus [name], male   DOB: [birth year], 69 y.o.   MRN: [id number] 4 Days Post-Op  Subjective: Pt c/o SOB this morning.  O2 sat last night 89% on RA, but up today to 96%on 2L.  Only pulling 750 on IS.  Has a lot of congestion from NGT.  Passing some flatus.  No abdominal pain.  Denies calf pain  Objective: Vital signs in last 24 hours: Temp:  [98.1 F (36.7 C)-99.9 F (37.7 C)] 98.1 F (36.7 C) (06/22 0539) Pulse Rate:  [102-144] 141 (06/22 0902) Resp:  [16-20] 20 (06/22 0901) BP: (156-188)/(93-128) 163/113 mmHg (06/22 0902) SpO2:  [89 %-97 %] 96 % (06/22 0901) Last BM Date: 09/13/14  Intake/Output from previous day: 09/25/2022 0701 - 06/22 0700 In: 800 [I.V.:800] Out: 1175 [Urine:775; Emesis/NG output:400] Intake/Output this shift:    PE: Gen: appears to not feel well, but no obvious distress Heart: tachycardic with ectopic beats, EKG confirms sinus tach with PAC, PVC Lungs: a lot of upper airway congestion from NGT, rhonchus breath sounds bilaterally Abd: soft, Nt, hernia is still soft and reducible, NGT with some bilious output, slightly blood tinged  Lab Results:   Recent Labs  09/17/14 0800  WBC 9.4  HGB 16.0  HCT 47.2  PLT 159   BMET  Recent Labs  09/17/14 0800  NA 133*  K 3.5  CL 101  CO2 26  GLUCOSE 111*  BUN 14  CREATININE 1.18  CALCIUM 8.8*   PT/INR No results for input(s): LABPROT, INR in the last 72 hours. CMP     Component Value Date/Time   NA 133* 09/17/2014 0800   K 3.5 09/17/2014 0800   CL 101 09/17/2014 0800   CO2 26 09/17/2014 0800   GLUCOSE 111* 09/17/2014 0800   BUN 14 09/17/2014 0800   CREATININE 1.18 09/17/2014 0800   CALCIUM 8.8* 09/17/2014 0800   PROT 7.8 09/13/2014 1222   ALBUMIN 4.6 09/13/2014 1222   AST 28 09/13/2014 1222   ALT 25 09/13/2014 1222   ALKPHOS 70 09/13/2014 1222   BILITOT 1.1 09/13/2014 1222   GFRNONAA >60 09/17/2014 0800   GFRAA >60 09/17/2014 0800   Lipase     Component Value Date/Time   LIPASE  14* 09/13/2014 1222       Studies/Results: Dg Abd 2 Views  Sep 25, 2014   CLINICAL DATA:  Partial small bowel obstruction.  Distended abdomen.  EXAM: ABDOMEN - 2 VIEW  COMPARISON:  09/14/2014  FINDINGS: Interval placement of enteric tube with tip just below the expected region of the gastroesophageal junction as this could be advanced 8-10 cm.  Continued evidence of multiple air-filled dilated small bowel loops with differential air-fluid levels. Small bowel measures up to 5.7 cm in diameter. Appearance slightly worse overall compared to the previous exam. Minimal air throughout the colon. No definite free peritoneal air. Remainder of the exam is unchanged.  IMPRESSION: Continued evidence of small bowel obstruction with slight overall interval worsening.  Enteric tube with tip just below the expected region of the gastroesophageal junction. This could be advanced another 8-10 cm.   Electronically Signed   By: Elberta Fortis M.D.   On: 09-25-14 09:48    Anti-infectives: Anti-infectives    Start     Dose/Rate Route Frequency Ordered Stop   09/17/14 0600  cefOXitin (MEFOXIN) 2 g in dextrose 5 % 50 mL IVPB  Status:  Discontinued     2 g 100 mL/hr over 30 Minutes Intravenous On call to O.R. 2014/09/25  1206 09/16/14 1323   09/17/14 0600  ceFAZolin (ANCEF) IVPB 2 g/50 mL premix     2 g 100 mL/hr over 30 Minutes Intravenous  Once 09/16/14 1319         Assessment/Plan  1. PSBO -OR cancelled today due to acute medical problems, see below -patient hates NGT as it is causing lots of upper airway congestion, but I have explained to him that he has to keep this in order to prevent aspiration, etc.  Will get suction set up in SDU to help alleviate some of this. -abdomen is stable and benign.  I do not see any evidence that this is the source of his tachycardia or his SOB 2. SOB -patient has some rhonchus breath sounds.  ? PNA vs PE as he now has tachycardia and HTN that isn't responding well to his  hydralazine and his lopressor. -STAT CTA of his chest has been ordered 3. HTN -on scheduled lopressor, 10mg  q6h -on prn hydralazine, given already this am.  BP still 160-180s/110-120s -I have called the hospitalist for a consult to help [place] out  -transfer to SDU for closer monitoring    LOS: 2 days    Melis Trochez E 09/17/2014, 9:28 AM Pager: [phone]

## 2014-09-17 NOTE — Consult Note (Addendum)
Requesting physician: Avel Peace  Reason for consultation: Hypertensive urgency, tachycardia and hypoxia   History of Present Illness: 69 year old obese male with history of hypertension, anxiety who was admitted to surgery on 6/18 for an incarcerated umbilical hernia ( with prior ventral hernia repair 20 years bac ) with partial small bowel obstruction. NG tube placed on 6/20 decompression and being managed conservatively. Overnight patient was hypoxic to 89% on room air. This morning he was found to have uncontrolled hypertension with blood pressure as high as 187/128 mmHg, tachycardic up to 140s with EKG done showing sinus tachycardia with multiple PVCs. CBC and BMET done this morning unremarkable. His total NG output in past 24 hours were 400 mL. Only pulled 750 on IS. She denies any chest pain, shortness of breath but feels congested, nausea, vomiting, abdominal pain, bowel or urinary symptoms. Denies headache, dizziness, palpitations or diaphoresis.   Patient given 10 mg IV Lopressor and 20 mg IV hydralazine without much improvement in his blood pressure.  hospitalist consulted for management of his uncontrolled blood pressure and tachycardia.  Labs reviewed CBC and BMET this am normal.  EKG: Sinus tachycardia at 139 with PVCs, no ST-T changes   Allergies:  No Known Allergies    Past Medical History  Diagnosis Date  . Hypertension   . Urinary hesitancy   . Hydrocele, right   . Spermatocele   . Anxiety     Past Surgical History  Procedure Laterality Date  . Umbilical hernia repair  1998  . Scrotal exploration  06/20/2011    Procedure: SCROTUM EXPLORATION;  Surgeon: Valetta Fuller, MD;  Location: [facility];  Service: Urology;  Laterality: Right;  . Hydrocele excision  06/20/2011    Procedure: HYDROCELECTOMY ADULT;  Surgeon: Valetta Fuller, MD;  Location: [facility];  Service: Urology;  Laterality: Right;  . Spermatocelectomy  06/20/2011   Procedure: SPERMATOCELECTOMY;  Surgeon: Valetta Fuller, MD;  Location: [facility];  Service: Urology;  Laterality: Right;    Medications:  Scheduled Meds: .  ceFAZolin (ANCEF) IV  2 g Intravenous Once  . furosemide  40 mg Intravenous Once  . [START ON 09/18/2014] heparin  5,000 Units Subcutaneous 3 times per day  . labetalol  10 mg Intravenous Once  . LORazepam  1 mg Intravenous QHS  . metoprolol  10 mg Intravenous 4 times per day   Continuous Infusions: . dextrose 5 % and 0.45 % NaCl with KCl 20 mEq/L 100 mL/hr at 09/17/14 0040   PRN Meds:.acetaminophen, hydrALAZINE, HYDROcodone-acetaminophen, menthol-cetylpyridinium, morphine injection, ondansetron, phenol  Social History:  reports that he quit smoking about 56 years ago. His smoking use included Cigarettes. He has never used smokeless tobacco. He reports that he drinks alcohol. He reports that he does not use illicit drugs.  Family history No history of heart disease,  Review of Systems:  As outlined in history of present illness. 12 point review of systems otherwise unremarkable   Physical Exam:  Filed Vitals:   09/17/14 0539 09/17/14 0711 09/17/14 0901 09/17/14 0902  BP: 188/109 159/104 187/128 163/113  Pulse: 116 102 144 141  Temp: 98.1 F (36.7 C)     TempSrc: Oral     Resp: 16  20   Height:      Weight:      SpO2: 97%  96%      Intake/Output Summary (Last 24 hours) at 09/17/14 0957 Last data filed at 09/17/14 0540  Gross per 24 hour  Intake    800 ml  Output    875 ml  Net    -75 ml    General: Early male in no acute distress HEENT: Pallor, NG in place, dry mucosa Heart: S 1 and S2 tachycardic, no murmurs rub or gallop Lungs: Bilateral scattered rhonchi, fine left basilar crackles Abdomen: Soft, mild distention, nontender, bowel sounds present Extremities: Warm, trace edema over left lower extremity, no calf swelling or tenderness Neuro: Alert and oriented  Labs on Admission:  CBC:     Component Value Date/Time   WBC 9.4 09/17/2014 0800   HGB 16.0 09/17/2014 0800   HCT 47.2 09/17/2014 0800   PLT 159 09/17/2014 0800   MCV 85.8 09/17/2014 0800   NEUTROABS 4.2 05/07/2012 1550   LYMPHSABS 1.7 05/07/2012 1550   MONOABS 0.4 05/07/2012 1550   EOSABS 0.0 05/07/2012 1550   BASOSABS 0.0 05/07/2012 1550    Basic Metabolic Panel:    Component Value Date/Time   NA 133* 09/17/2014 0800   K 3.5 09/17/2014 0800   CL 101 09/17/2014 0800   CO2 26 09/17/2014 0800   BUN 14 09/17/2014 0800   CREATININE 1.18 09/17/2014 0800   GLUCOSE 111* 09/17/2014 0800   CALCIUM 8.8* 09/17/2014 0800    Radiological Exams on Admission: Dg Abd 2 Views  09/16/2014   CLINICAL DATA:  Partial small bowel obstruction.  Distended abdomen.  EXAM: ABDOMEN - 2 VIEW  COMPARISON:  09/14/2014  FINDINGS: Interval placement of enteric tube with tip just below the expected region of the gastroesophageal junction as this could be advanced 8-10 cm.  Continued evidence of multiple air-filled dilated small bowel loops with differential air-fluid levels. Small bowel measures up to 5.7 cm in diameter. Appearance slightly worse overall compared to the previous exam. Minimal air throughout the colon. No definite free peritoneal air. Remainder of the exam is unchanged.  IMPRESSION: Continued evidence of small bowel obstruction with slight overall interval worsening.  Enteric tube with tip just below the expected region of the gastroesophageal junction. This could be advanced another 8-10 cm.   Electronically Signed   By: Elberta Fortis M.D.   On: 09/16/2014 09:48    Assessment/Plan Hypertensive urgency with tachycardia and hypoxia Differential includes acute PE, pulmonary edema vs pneumonia. Primary team has ordered for CT angiogram of the chest to rule out PE and agree with the plan. -Agree with transferring patient to stepdown. -Patient received 10 mg IV Lopressor 20 mg IV hydralazine earlier this morning without much  improvement in his blood pressure. Ordered 10 mg IV labetalol and 40 mg IV Lasix. -02 via [place]. Continue with when necessary IV hydralazine and Lopressor for blood pressure and heart rate control. -will hold fluids unless CT rules out fluid overload. - when necessary Ativan for anxiety. Monitor on telemetry. Continue bedside spirometry. -check magnesium. k normal.  incarcerated umbilical hernia with partial small bowel obstruction Management per primary team.  DVT prophylaxis: Subcutaneous heparin  Thank you for the consult. Hospitalist will continue to follow. Plan discussed with Barnetta Chapel, PA.   Time Spent on Admission: 50 minutes  Avanell Banwart 09/17/2014, 9:57 AM

## 2014-09-17 NOTE — Progress Notes (Signed)
PHARMACIST - PHYSICIAN COMMUNICATION CONCERNING:  IV heparin  Pt on IV heparin for double PE.  Please see note written earlier by Bernadene Person, PharmD, for more details.   IV heparin currently running at 1650 units/hr.  First heparin level = 0.63, therapeutic (goal 0.3-0.7). No issues per RN noted.    RECOMMENDATION: Recheck heparin level in 6 hours with AM labs.   Haynes Hoehn, PharmD, BCPS 09/17/2014, 9:55 PM  Pager: [phone]

## 2014-09-17 NOTE — Progress Notes (Signed)
Verbal order given by MD Dhungel to stop patient's IV Fluids, order entered, report also called and given to Gael in ICU Stanford Breed RN 10:21 AM 09-17-2014

## 2014-09-18 ENCOUNTER — Inpatient Hospital Stay (HOSPITAL_COMMUNITY): Payer: Medicare Other

## 2014-09-18 ENCOUNTER — Encounter (HOSPITAL_COMMUNITY): Payer: Self-pay | Admitting: Cardiology

## 2014-09-18 DIAGNOSIS — K5669 Other intestinal obstruction: Secondary | ICD-10-CM

## 2014-09-18 LAB — HEPARIN LEVEL (UNFRACTIONATED): HEPARIN UNFRACTIONATED: 0.6 [IU]/mL (ref 0.30–0.70)

## 2014-09-18 LAB — CARDIOLIPIN ANTIBODIES, IGG, IGM, IGA: Anticardiolipin IgG: <9 GPL U/mL (ref 0–14)

## 2014-09-18 LAB — HOMOCYSTEINE: Homocysteine: 15.2 umol/L — ABNORMAL HIGH (ref 0.0–15.0)

## 2014-09-18 LAB — TROPONIN I: TROPONIN I: 0.12 ng/mL — AB (ref <0.031)

## 2014-09-18 MED ORDER — METOPROLOL SUCCINATE ER 25 MG PO TB24
25.0000 mg | ORAL_TABLET | Freq: Every day | ORAL | Status: DC
  Administered 2014-09-18: 25 mg via ORAL
  Filled 2014-09-18: qty 1

## 2014-09-18 MED ORDER — HYDRALAZINE HCL 20 MG/ML IJ SOLN
10.0000 mg | INTRAMUSCULAR | Status: DC | PRN
  Administered 2014-09-19 (×3): 10 mg via INTRAVENOUS
  Filled 2014-09-18 (×3): qty 1

## 2014-09-18 NOTE — Progress Notes (Signed)
5 Days Post-Op  Subjective: No abdominal pain.  Reports that he is passing gas and moving bowels.  Objective: Vital signs in last 24 hours: Temp:  [98.1 F (36.7 C)-100.2 F (37.9 C)] 98.1 F (36.7 C) (06/23 0800) Pulse Rate:  [115-138] 115 (06/22 2000) Resp:  [19-38] 25 (06/23 0800) BP: (112-173)/(77-116) 150/109 mmHg (06/23 0800) SpO2:  [91 %-97 %] 96 % (06/23 0800) Weight:  [109.6 kg (241 lb 10 oz)] 109.6 kg (241 lb 10 oz) (06/22 1250) Last BM Date: 09/13/14  Intake/Output from previous day: 06/22 0701 - 06/23 0700 In: 1417.5 [I.V.:247.5; IV Piggyback:1000] Out: 3430 [Urine:2730; Emesis/NG output:700] Intake/Output this shift: Total I/O In: 73 [I.V.:33; Other:20; NG/GT:20] Out: 125 [Urine:125]  PE: General- In NAD Abdomen-soft, not tender, active bowel sounds, hernia is reduced.  Lab Results:   Recent Labs  09/17/14 0800  WBC 9.4  HGB 16.0  HCT 47.2  PLT 159   BMET  Recent Labs  09/17/14 0800  NA 133*  K 3.5  CL 101  CO2 26  GLUCOSE 111*  BUN 14  CREATININE 1.18  CALCIUM 8.8*   PT/INR  Recent Labs  09/17/14 1330  LABPROT 14.6  INR 1.12   Comprehensive Metabolic Panel:    Component Value Date/Time   NA 133* 09/17/2014 0800   NA 143 09/14/2014 0528   K 3.5 09/17/2014 0800   K 4.0 09/14/2014 0528   CL 101 09/17/2014 0800   CL 105 09/14/2014 0528   CO2 26 09/17/2014 0800   CO2 29 09/14/2014 0528   BUN 14 09/17/2014 0800   BUN 20 09/14/2014 0528   CREATININE 1.18 09/17/2014 0800   CREATININE 1.04 09/14/2014 0528   GLUCOSE 111* 09/17/2014 0800   GLUCOSE 118* 09/14/2014 0528   CALCIUM 8.8* 09/17/2014 0800   CALCIUM 8.7* 09/14/2014 0528   AST 28 09/13/2014 1222   AST 25 05/07/2012 1550   ALT 25 09/13/2014 1222   ALT 22 05/07/2012 1550   ALKPHOS 70 09/13/2014 1222   ALKPHOS 66 05/07/2012 1550   BILITOT 1.1 09/13/2014 1222   BILITOT 0.4 05/07/2012 1550   PROT 7.8 09/13/2014 1222   PROT 7.6 05/07/2012 1550   ALBUMIN 4.6 09/13/2014  1222   ALBUMIN 4.2 05/07/2012 1550     Studies/Results: Ct Angio Chest Pe W/cm &/or Wo Cm  09/17/2014   CLINICAL DATA:  Shortness of breath, tachycardia.  EXAM: CT ANGIOGRAPHY CHEST WITH CONTRAST  TECHNIQUE: Multidetector CT imaging of the chest was performed using the standard protocol during bolus administration of intravenous contrast. Multiplanar CT image reconstructions and MIPs were obtained to evaluate the vascular anatomy.  CONTRAST:  OMNIPAQUE IOHEXOL 350 MG/ML SOLN  COMPARISON:  None.  FINDINGS: Filling defects noted in the right lower lobe pulmonary artery compatible with acute pulmonary embolus. Pulmonary embolus also likely extends into the right upper lobe pulmonary artery. No pulmonary emboli on the left.  Heart is normal size. Aorta is normal caliber. No mediastinal, hilar, or axillary adenopathy. Chest wall soft tissues are unremarkable.  Linear areas of atelectasis in the left lower lobe and right middle lobe. No pleural effusions.  NG tube is in place with the tip in the stomach.  Review of the MIP images confirms the above findings.  IMPRESSION: Small to medium sized pulmonary emboli noted in the right upper lobe and right lower lobe pulmonary arteries.  Areas of atelectasis in the left lower lobe and right middle lobe.  NG tube in the stomach.  These results  will be called to the ordering clinician or representative by the Radiologist Assistant, and communication documented in the PACS or zVision Dashboard.   Electronically Signed   By: Charlett Nose M.D.   On: 09/17/2014 12:25   Dg Abd 2 Views  09/16/2014   CLINICAL DATA:  Partial small bowel obstruction.  Distended abdomen.  EXAM: ABDOMEN - 2 VIEW  COMPARISON:  09/14/2014  FINDINGS: Interval placement of enteric tube with tip just below the expected region of the gastroesophageal junction as this could be advanced 8-10 cm.  Continued evidence of multiple air-filled dilated small bowel loops with differential air-fluid levels.  Small bowel measures up to 5.7 cm in diameter. Appearance slightly worse overall compared to the previous exam. Minimal air throughout the colon. No definite free peritoneal air. Remainder of the exam is unchanged.  IMPRESSION: Continued evidence of small bowel obstruction with slight overall interval worsening.  Enteric tube with tip just below the expected region of the gastroesophageal junction. This could be advanced another 8-10 cm.   Electronically Signed   By: Elberta Fortis M.D.   On: 09/16/2014 09:48    Anti-infectives: Anti-infectives    Start     Dose/Rate Route Frequency Ordered Stop   09/17/14 0600  cefOXitin (MEFOXIN) 2 g in dextrose 5 % 50 mL IVPB  Status:  Discontinued     2 g 100 mL/hr over 30 Minutes Intravenous On call to O.R. 09/16/14 1206 09/16/14 1323   09/17/14 0600  ceFAZolin (ANCEF) IVPB 2 g/50 mL premix  Status:  Discontinued     2 g 100 mL/hr over 30 Minutes Intravenous  Once 09/16/14 1319 09/17/14 1510      Assessment Principal Problem:   Small bowel obstruction secondary to recurrent ventral hernia-appears resolved clinically Active Problems:   Acute pulmonary embolism-on IV Heparin; has some evidence of cardiac strain       LOS: 3 days   Plan: Clamp ng and try clear liquids.  Timing of surgery for recurrent ventral hernia to be determined.   Lareen Mullings Shela Commons 09/18/2014

## 2014-09-18 NOTE — Progress Notes (Signed)
ANTICOAGULATION CONSULT NOTE - Follow Up Consult  Pharmacy Consult for Heparin Indication: pulmonary embolus  No Known Allergies  Patient Measurements: Height: 5\' 11"  (180.3 cm) Weight: 241 lb 10 oz (109.6 kg) IBW/kg (Calculated) : 75.3 Heparin Dosing Weight:   Vital Signs: Temp: 99 F (37.2 C) (06/23 0340) Temp Source: Oral (06/23 0340) BP: 131/96 mmHg (06/23 0500) Pulse Rate: 115 (06/22 2000)  Labs:  Recent Labs  09/17/14 0800 09/17/14 1330 09/17/14 2114 09/18/14 0238 09/18/14 0345  HGB 16.0  --   --   --   --   HCT 47.2  --   --   --   --   PLT 159  --   --   --   --   APTT  --  31  --   --   --   LABPROT  --  14.6  --   --   --   INR  --  1.12  --   --   --   HEPARINUNFRC  --   --  0.63  --  0.60  CREATININE 1.18  --   --   --   --   TROPONINI  --  0.20* 0.14* 0.12*  --     Estimated Creatinine Clearance: 77.5 mL/min (by C-G formula based on Cr of 1.18).   Medications:  Infusions:  . heparin 1,650 Units/hr (09/18/14 0500)    Assessment: Patient with second heparin level at goal.  No heparin issues noted.  Goal of Therapy:  Heparin level 0.3-0.7 units/ml Monitor platelets by anticoagulation protocol: Yes   Plan:  Continue heparin drip at current rate Recheck level with daily heparin level.  Darlina Guys, Jacquenette Shone Crowford 09/18/2014,6:00 AM

## 2014-09-18 NOTE — Consult Note (Signed)
Reason for Consult: elevated troponin with acute PE " Small to medium sized pulmonary emboli noted in the right upper lobe and right lower lobe pulmonary arteries"   Referring Physician: Dr. Ann Lions   PCP:  Kristine Garbe, MD  Primary Cardiologist:new  Jesus [name] is an 69 y.o. male.    Chief Complaint: admitted 09/13/14 with bowel obstruction, with ventral hernia   HPI:  69 yoAAM admitted with bowel obstruction with ventral hernia on 09/13/14.  No prior hx of CAD, + HTN.  Pt had been planned for surgery but 09/17/14 developed HTN urgency, tachycardia and hypoxia.  Treated with IV lopressor and IV hydralazine, CT scan revealed Small to medium sized pulmonary emboli noted in the right upper lobe and right lower lobe pulmonary arteries.  Pt was transferred to stepdown and IV heparin was started. Hypercoagulable panel was drawn first.  Surgery has been delayed indefinitely.    Troponins are elevated 0.20-->0.14-->0.12.   EKG 09/15/14 SR at 94 and non specific ST changes but no changes from previous EKG.   EKG 09/17/14  ST at 137 +PAC and PVC, otherwise no acute changes  Echo: Study Conclusions - Left ventricle: The cavity size was normal. Systolic function was normal. The estimated ejection fraction was in the range of 55% to 60%. Wall motion was normal; there were no regional wall motion abnormalities. - Impressions: RV not well seen Abnormal septal motion and patient tachycardia during exam. Impressions: - RV not well seen Abnormal septal motion and patient tachycardia during exam.  Venous doppler Negative for DVT.  Currently no chest pain and abd pain resolved currently.  No family hx of CAD and pt without chest pain with exertion or at rest prior to admit.  No previous SOB.     Past Medical History  Diagnosis Date  . Hypertension   . Urinary hesitancy   . Hydrocele, right   . Spermatocele   . Anxiety     Past Surgical History  Procedure Laterality  Date  . Umbilical hernia repair  1998  . Scrotal exploration  06/20/2011    Procedure: SCROTUM EXPLORATION;  Surgeon: Bernestine Amass, MD;  Location: [facility];  Service: Urology;  Laterality: Right;  . Hydrocele excision  06/20/2011    Procedure: HYDROCELECTOMY ADULT;  Surgeon: Bernestine Amass, MD;  Location: [facility];  Service: Urology;  Laterality: Right;  . Spermatocelectomy  06/20/2011    Procedure: SPERMATOCELECTOMY;  Surgeon: Bernestine Amass, MD;  Location: [facility];  Service: Urology;  Laterality: Right;    Family History  Problem Relation Age of Onset  . Cancer Father   . Healthy Sister   . Healthy Brother   . Healthy Sister    Social History:  reports that he quit smoking about 56 years ago. His smoking use included Cigarettes. He has never used smokeless tobacco. He reports that he drinks alcohol. He reports that he does not use illicit drugs.  Allergies: No Known Allergies  OUTPATIENT MEDICATIONS: No current facility-administered medications on file prior to encounter.   Current Outpatient Prescriptions on File Prior to Encounter  Medication Sig Dispense Refill  . ibuprofen (ADVIL,MOTRIN) 200 MG tablet Take 800 mg by mouth every 6 (six) hours as needed for moderate pain. pain    . LORazepam (ATIVAN) 1 MG tablet Take 0.5 mg by mouth 2 (two) times daily.     . metoprolol succinate (TOPROL-XL) 25 MG 24 hr tablet  Take 25 mg by mouth 2 (two) times daily.    . Multiple Vitamin (MULTIVITAMIN WITH MINERALS) TABS tablet Take 1 tablet by mouth daily.    [name] oxymetazoline (AFRIN) 0.05 % nasal spray Place 1 spray into both nostrils 2 (two) times daily as needed for congestion.    . Tamsulosin HCl (FLOMAX) 0.4 MG CAPS Take 0.4 mg by mouth daily.    . cyclobenzaprine (FLEXERIL) 10 MG tablet Take 1 tablet (10 mg total) by mouth 2 (two) times daily as needed for muscle spasms. (Patient not taking: Reported on 09/13/2014) 10 tablet 0  .  dextromethorphan (DELSYM) 30 MG/5ML liquid Take 2.5 mLs (15 mg total) by mouth 2 (two) times daily. (Patient not taking: Reported on 05/19/2014) 89 mL 0  . guaiFENesin (ROBITUSSIN) 100 MG/5ML liquid Take 5-10 mLs (100-200 mg total) by mouth every 4 (four) hours as needed for cough. (Patient not taking: Reported on 09/13/2014) 60 mL 0  . predniSONE (DELTASONE) 20 MG tablet Take 2 tablets (40 mg total) by mouth daily. (Patient not taking: Reported on 05/19/2014) 10 tablet 0  . traMADol (ULTRAM) 50 MG tablet Take 1 tablet (50 mg total) by mouth every 6 (six) hours as needed. (Patient not taking: Reported on 09/13/2014) 15 tablet 0   CURRENT MEDICATIONS: Scheduled Meds: . labetalol  20 mg Intravenous Once  . LORazepam  1 mg Intravenous QHS  . metoprolol  10 mg Intravenous 4 times per day   Continuous Infusions: . heparin 1,650 Units/hr (09/18/14 0600)   PRN Meds:.acetaminophen, hydrALAZINE, HYDROcodone-acetaminophen, menthol-cetylpyridinium, morphine injection, ondansetron, phenol, sodium chloride   Results for orders placed or performed during the hospital encounter of 09/13/14 (from the past 48 hour(s))  CBC     Status: None   Collection Time: 09/17/14  8:00 AM  Result Value Ref Range   WBC 9.4 4.0 - 10.5 K/uL   RBC 5.50 4.22 - 5.81 MIL/uL   Hemoglobin 16.0 13.0 - 17.0 g/dL   HCT 47.2 39.0 - 52.0 %   MCV 85.8 78.0 - 100.0 fL   MCH 29.1 26.0 - 34.0 pg   MCHC 33.9 30.0 - 36.0 g/dL   RDW 13.5 11.5 - 15.5 %   Platelets 159 150 - 400 K/uL  Basic metabolic panel     Status: Abnormal   Collection Time: 09/17/14  8:00 AM  Result Value Ref Range   Sodium 133 (L) 135 - 145 mmol/L   Potassium 3.5 3.5 - 5.1 mmol/L   Chloride 101 101 - 111 mmol/L   CO2 26 22 - 32 mmol/L   Glucose, Bld 111 (H) 65 - 99 mg/dL   BUN 14 6 - 20 mg/dL   Creatinine, Ser 1.18 0.61 - 1.24 mg/dL   Calcium 8.8 (L) 8.9 - 10.3 mg/dL   GFR calc non Af Amer >60 >60 mL/min   GFR calc Af Amer >60 >60 mL/min    Comment:  (NOTE) The eGFR has been calculated using the CKD EPI equation. This calculation has not been validated in all clinical situations. eGFR's persistently <60 mL/min signify possible Chronic Kidney Disease.    Anion gap 6 5 - 15  Type and screen     Status: None   Collection Time: 09/17/14  8:00 AM  Result Value Ref Range   ABO/RH(D) A POS    Antibody Screen NEG    Sample Expiration 09/20/2014   ABO/Rh     Status: None   Collection Time: 09/17/14  8:00 AM  Result Value Ref  Range   ABO/RH(D) A POS   Magnesium     Status: None   Collection Time: 09/17/14  8:00 AM  Result Value Ref Range   Magnesium 2.0 1.7 - 2.4 mg/dL  Antithrombin III     Status: None   Collection Time: 09/17/14  1:30 PM  Result Value Ref Range   AntiThromb III Func 101 75 - 120 %    Comment: Performed at [facility]  APTT     Status: None   Collection Time: 09/17/14  1:30 PM  Result Value Ref Range   aPTT 31 24 - 37 seconds  Protime-INR     Status: None   Collection Time: 09/17/14  1:30 PM  Result Value Ref Range   Prothrombin Time 14.6 11.6 - 15.2 seconds   INR 1.12 0.00 - 1.49  Troponin I (q 6hr x 3)     Status: Abnormal   Collection Time: 09/17/14  1:30 PM  Result Value Ref Range   Troponin I 0.20 (H) <0.031 ng/mL    Comment:        PERSISTENTLY INCREASED TROPONIN VALUES IN THE RANGE OF 0.04-0.49 ng/mL CAN BE SEEN IN:       -UNSTABLE ANGINA       -CONGESTIVE HEART FAILURE       -MYOCARDITIS       -CHEST TRAUMA       -ARRYHTHMIAS       -LATE PRESENTING MYOCARDIAL INFARCTION       -COPD   CLINICAL FOLLOW-UP RECOMMENDED.   Blood gas, arterial     Status: Abnormal   Collection Time: 09/17/14  2:52 PM  Result Value Ref Range   O2 Content 3.0 L/min   Delivery systems NASAL CANNULA    pH, Arterial 7.463 (H) 7.350 - 7.450   pCO2 arterial 29.1 (L) 35.0 - 45.0 mmHg   pO2, Arterial 78.2 (L) 80.0 - 100.0 mmHg   Bicarbonate 20.6 20.0 - 24.0 mEq/L   TCO2 17.2 0 - 100 mmol/L   Acid-base deficit  1.4 0.0 - 2.0 mmol/L   O2 Saturation 95.4 %   Patient temperature 98.6    Collection site LEFT RADIAL    Drawn by [id number]    Sample type ARTERIAL DRAW    Allens test (pass/fail) PASS PASS  Heparin level (unfractionated)     Status: None   Collection Time: 09/17/14  9:14 PM  Result Value Ref Range   Heparin Unfractionated 0.63 0.30 - 0.70 IU/mL    Comment:        IF HEPARIN RESULTS ARE BELOW EXPECTED VALUES, AND PATIENT DOSAGE HAS BEEN CONFIRMED, SUGGEST FOLLOW UP TESTING OF ANTITHROMBIN III LEVELS.   Troponin I (q 6hr x 3)     Status: Abnormal   Collection Time: 09/17/14  9:14 PM  Result Value Ref Range   Troponin I 0.14 (H) <0.031 ng/mL    Comment:        PERSISTENTLY INCREASED TROPONIN VALUES IN THE RANGE OF 0.04-0.49 ng/mL CAN BE SEEN IN:       -UNSTABLE ANGINA       -CONGESTIVE HEART FAILURE       -MYOCARDITIS       -CHEST TRAUMA       -ARRYHTHMIAS       -LATE PRESENTING MYOCARDIAL INFARCTION       -COPD   CLINICAL FOLLOW-UP RECOMMENDED.   Troponin I (q 6hr x 3)     Status: Abnormal   Collection Time: 09/18/14  2:38  AM  Result Value Ref Range   Troponin I 0.12 (H) <0.031 ng/mL    Comment:        PERSISTENTLY INCREASED TROPONIN VALUES IN THE RANGE OF 0.04-0.49 ng/mL CAN BE SEEN IN:       -UNSTABLE ANGINA       -CONGESTIVE HEART FAILURE       -MYOCARDITIS       -CHEST TRAUMA       -ARRYHTHMIAS       -LATE PRESENTING MYOCARDIAL INFARCTION       -COPD   CLINICAL FOLLOW-UP RECOMMENDED.   Heparin level (unfractionated)     Status: None   Collection Time: 09/18/14  3:45 AM  Result Value Ref Range   Heparin Unfractionated 0.60 0.30 - 0.70 IU/mL    Comment:        IF HEPARIN RESULTS ARE BELOW EXPECTED VALUES, AND PATIENT DOSAGE HAS BEEN CONFIRMED, SUGGEST FOLLOW UP TESTING OF ANTITHROMBIN III LEVELS.    Ct Angio Chest Pe W/cm &/or Wo Cm  09/17/2014   CLINICAL DATA:  Shortness of breath, tachycardia.  EXAM: CT ANGIOGRAPHY CHEST WITH CONTRAST  TECHNIQUE:  Multidetector CT imaging of the chest was performed using the standard protocol during bolus administration of intravenous contrast. Multiplanar CT image reconstructions and MIPs were obtained to evaluate the vascular anatomy.  CONTRAST:  123m OMNIPAQUE IOHEXOL 350 MG/ML SOLN  COMPARISON:  None.  FINDINGS: Filling defects noted in the right lower lobe pulmonary artery compatible with acute pulmonary embolus. Pulmonary embolus also likely extends into the right upper lobe pulmonary artery. No pulmonary emboli on the left.  Heart is normal size. Aorta is normal caliber. No mediastinal, hilar, or axillary adenopathy. Chest wall soft tissues are unremarkable.  Linear areas of atelectasis in the left lower lobe and right middle lobe. No pleural effusions.  NG tube is in place with the tip in the stomach.  Review of the MIP images confirms the above findings.  IMPRESSION: Small to medium sized pulmonary emboli noted in the right upper lobe and right lower lobe pulmonary arteries.  Areas of atelectasis in the left lower lobe and right middle lobe.  NG tube in the stomach.  These results will be called to the ordering clinician or representative by the Radiologist Assistant, and communication documented in the PACS or zVision Dashboard.   Electronically Signed   By: KRolm BaptiseM.D.   On: 09/17/2014 12:25   Dg Abd 2 Views  09/16/2014   CLINICAL DATA:  Partial small bowel obstruction.  Distended abdomen.  EXAM: ABDOMEN - 2 VIEW  COMPARISON:  09/14/2014  FINDINGS: Interval placement of enteric tube with tip just below the expected region of the gastroesophageal junction as this could be advanced 8-10 cm.  Continued evidence of multiple air-filled dilated small bowel loops with differential air-fluid levels. Small bowel measures up to 5.7 cm in diameter. Appearance slightly worse overall compared to the previous exam. Minimal air throughout the colon. No definite free peritoneal air. Remainder of the exam is unchanged.   IMPRESSION: Continued evidence of small bowel obstruction with slight overall interval worsening.  Enteric tube with tip just below the expected region of the gastroesophageal junction. This could be advanced another 8-10 cm.   Electronically Signed   By: DMarin OlpM.D.   On: 09/16/2014 09:48    RDGL:OVFIEPP:IRcolds or fevers, no weight changes Skin:no rashes or ulcers HEENT:no blurred vision, no congestion CV:see HPI PUL:see HPI GI:no diarrhea constipation or melena, no indigestion  GU:no hematuria, no dysuria MS:no joint pain, no claudication Neuro:no syncope, no lightheadedness Endo:no diabetes, no thyroid disease   Blood pressure 150/109, pulse 115, temperature 98.1 F (36.7 C), temperature source Oral, resp. rate 25, height _0  (1.803 m), weight 241 lb 10 oz (109.6 kg), SpO2 96 %.  Wt Readings from Last 3 Encounters:  09/17/14 241 lb 10 oz (109.6 kg)  11/16/11 240 lb (108.863 kg)  06/13/11 240 lb (108.863 kg)    PE: General:Pleasant affect, NAD Skin:Warm and dry, brisk capillary refill HEENT:normocephalic, sclera clear, mucus membranes moist, NG tube in place Neck:supple, no JVD, no bruits, no adenopathy  Heart:S1S2 RRR without murmur, gallup, rub or click Lungs:clear, ant  without rales, rhonchi, or wheezes YEL:YHTM, non tender, + BS, do not palpate liver spleen or masses Ext:no lower ext edema, 2+ pedal pulses, 2+ radial pulses Neuro:alert and oriented X 3, MAE, follows commands, + facial symmetry   Tele:  stach with PVCs.   Assessment/Plan Principal Problem:   Small bowel obstruction- surgery will continue to monitor, no surgery for now.  Active Problems:   Acute pulmonary embolism- on IV heparin - hypercoagulable panel pending   Malignant hypertension- improved today 118/89 to 135/77   Tachycardia- still elevated mostly in 120s on IV metoprolol 10 mg every 6 hours, may need to increase to every 4 hours monitoring BP.   Elevated troponin- possibly due demand  ischemia with PE,  Though he may need stress test as outpt. MD to see.     [place]  Nurse Practitioner Certified [place] Pager [phone] or after 5pm or weekends call [phone] 09/18/2014, 9:10 AM  History and all data above reviewed.  Patient examined.  I agree with the findings as above.  Elevated enzymes in the face of a pulmonary emboli, hypertensive urgency and tachycardia.   EKG was unremarkable and he has had no cardiac symptoms prior to this.   The patient exam reveals COR:RRR  ,  Lungs: clear  ,  Abd: decreased bowel soundsExt No edema.  All available labs, radiology testing, previous records reviewed. Agree with documented assessment and plan.   Elevated Troponin:  I do not suspect an acute coronary event. EKG and echo are not suggestive.  Elevated troponin due to demand ischemia.  No further in patient work up is planned at this time.  We will follow.   Jeneen Rinks Latressa Harries  12:02 PM  09/18/2014

## 2014-09-18 NOTE — Care Management Note (Signed)
Case Management Note  Patient Details  Name: Aariz Viebrock MRN: [id number] Date of Birth: [birth year]  Subjective/Objective:                 Surgical patient/[id number] developed P.E.    Action/Plan:  Home when stable   Expected Discharge Date:  [id number]               Expected Discharge Plan:     In-House Referral:     Discharge planning Services     Post Acute Care Choice:    Choice offered to:     DME Arranged:    DME Agency:     HH Arranged:    HH Agency:     Status of Service:     Medicare Important Message Given:    Date Medicare IM Given:    Medicare IM give by:    Date Additional Medicare IM Given:    Additional Medicare Important Message give by:     If discussed at Long Length of Stay Meetings, dates discussed:    Additional Comments:  Golda Acre, RN 09/18/2014, 9:09 AM

## 2014-09-18 NOTE — Progress Notes (Signed)
Patient Demographics  Jesus [name], is a 69 y.o. male, DOB - July 09, 1945, SVX:[id number]  Admit date - 09/13/2014   Admitting Physician Ovidio Kin, MD  Outpatient Primary MD for the patient is Karie Chimera, MD  LOS - 3                                     Consult Follow up   Chief Complaint  Patient presents with  . Vomiting       Admission HPI/Brief narrative: 70 year old male admitted with partial small bowel obstruction secondary to incarcerated umbilical hernia, develop tachycardia, hypoxia, CT chest angiogram significant for PE on 6/22, currently on heparin drip,Multiple small obstruction appears to be resolving clinically.  Subjective:   Jesus [name] today has, No headache, No chest pain, No abdominal pain - No Nausea, No new weakness tingling or numbness, No Cough - SOB.  reports he has been passing gas, had bowel movement day before yesterday .  Assessment & Plan    Principal Problem:   Small bowel obstruction Active Problems:   Acute pulmonary embolism   Malignant hypertension   Tachycardia   Elevated troponin   small bowel obstruction secondary to ventral hernia  - Appears to be clinically improving , patient on clear liquid diet trial today , NGT  clamped .  Pulmonary embolism  - Currently patient on IV heparin gtt, mass tachycardic, hypoxic , 2-D echo showing no evidence of right ventricular strain . - If no urgent surgery is indicated (which seems to be the case here as his SBO is improving) ,ideally would keep patient on anticoagulation at least for 6 weeks  prior to proceeding with surgery If no urgent surgery is indicated (which seems to be the case here as his SBO is improving) ,  would keep on IV heparin for another 24 hours , then we'll change to oral Eliquis for anticoagulation , which can be transitioned to Lovenox 1 week prior to surgery as outpatient, but ifthere is an  indication for emergent surgery , patient could be anticoagulated 4-5 days on IV heparin gtt prior  to surgery. - Follow on hypercoagulable workup  - Venous Doppler negative for DVT   Hypertension - Improved, and is currently tolerating clear liquid diet, will change his IV metoprolol to oral Toprol-XL dose(may need to increase home dose if remains tachycardic or blood pressure poorly controlled), will add when necessary hydralazine.   elevated troponin  - Cardiac consult greatly appreciated , most likely demand ischemia , no indication for further workup .    Code Status: Full   Family Communication: none at bedside     Procedures  none      Medications  Scheduled Meds: . labetalol  20 mg Intravenous Once  . LORazepam  1 mg Intravenous QHS  . metoprolol succinate  25 mg Oral Daily   Continuous Infusions: . heparin 1,650 Units/hr (09/18/14 0600)   PRN Meds:.acetaminophen, hydrALAZINE, hydrALAZINE, HYDROcodone-acetaminophen, menthol-cetylpyridinium, morphine injection, ondansetron, phenol, sodium chloride  DVT Prophylaxis  On heaprin gtt  Lab Results  Component Value Date   PLT 159 09/17/2014    Antibiotics    Anti-infectives  Start     Dose/Rate Route Frequency Ordered Stop   09/17/14 0600  cefOXitin (MEFOXIN) 2 g in dextrose 5 % 50 mL IVPB  Status:  Discontinued     2 g 100 mL/hr over 30 Minutes Intravenous On call to O.R. 09/16/14 1206 09/16/14 1323   09/17/14 0600  ceFAZolin (ANCEF) IVPB 2 g/50 mL premix  Status:  Discontinued     2 g 100 mL/hr over 30 Minutes Intravenous  Once 09/16/14 1319 09/17/14 1510          Objective:   Filed Vitals:   09/18/14 0900 09/18/14 1000 09/18/14 1100 09/18/14 1200  BP: 145/95 158/103 133/95 141/99  Pulse:      Temp:      TempSrc:      Resp: Height:      Weight:      SpO2: 96% 97% 95% 96%    Wt Readings from Last 3 Encounters:  09/17/14 109.6 kg (241 lb 10 oz)  11/16/11 108.863 kg (240 lb)    06/13/11 108.863 kg (240 lb)     Intake/Output Summary (Last 24 hours) at 09/18/14 1208 Last data filed at 09/18/14 1200  Gross per 24 hour  Intake 1616.5 ml  Output   3190 ml  Net -1573.5 ml     Physical Exam  Awake Alert, Oriented X 3, No new F.N deficits, Normal affect [place].AT,PERRAL Supple Neck,No JVD, No cervical lymphadenopathy appriciated.  Symmetrical Chest wall movement, Good air movement bilaterally, CTAB Tachycardic,No Gallops,Rubs or new Murmurs, No Parasternal Heave +ve B.Sounds, Abd Soft, No tenderness, No organomegaly appriciated, No rebound - guarding or rigidity. hernia is reduced. No Cyanosis, Clubbing or edema, No new Rash or bruise     Data Review   Micro Results Recent Results (from the past 240 hour(s))  MRSA PCR Screening     Status: None   Collection Time: 09/15/14  9:58 AM  Result Value Ref Range Status   MRSA by PCR NEGATIVE NEGATIVE Final    Comment:        The GeneXpert MRSA Assay (FDA approved for NASAL specimens only), is one component of a comprehensive MRSA colonization surveillance program. It is not intended to diagnose MRSA infection nor to guide or monitor treatment for MRSA infections.     Radiology Reports Ct Angio Chest Pe W/cm &/or Wo Cm  09/17/2014   CLINICAL DATA:  Shortness of breath, tachycardia.  EXAM: CT ANGIOGRAPHY CHEST WITH CONTRAST  TECHNIQUE: Multidetector CT imaging of the chest was performed using the standard protocol during bolus administration of intravenous contrast. Multiplanar CT image reconstructions and MIPs were obtained to evaluate the vascular anatomy.  CONTRAST:  OMNIPAQUE IOHEXOL 350 MG/ML SOLN  COMPARISON:  None.  FINDINGS: Filling defects noted in the right lower lobe pulmonary artery compatible with acute pulmonary embolus. Pulmonary embolus also likely extends into the right upper lobe pulmonary artery. No pulmonary emboli on the left.  Heart is normal size. Aorta is normal caliber. No  mediastinal, hilar, or axillary adenopathy. Chest wall soft tissues are unremarkable.  Linear areas of atelectasis in the left lower lobe and right middle lobe. No pleural effusions.  NG tube is in place with the tip in the stomach.  Review of the MIP images confirms the above findings.  IMPRESSION: Small to medium sized pulmonary emboli noted in the right upper lobe and right lower lobe pulmonary arteries.  Areas of atelectasis in the left lower lobe and right middle lobe.  NG tube in the stomach.  These results will be called to the ordering clinician or representative by the Radiologist Assistant, and communication documented in the PACS or zVision Dashboard.   Electronically Signed   By: Charlett Nose M.D.   On: 09/17/2014 12:25   Ct Abdomen Pelvis W Contrast  09/13/2014   CLINICAL DATA:  Vomiting, abdominal pain  EXAM: CT ABDOMEN AND PELVIS WITH CONTRAST  TECHNIQUE: Multidetector CT imaging of the abdomen and pelvis was performed using the standard protocol following bolus administration of intravenous contrast.  CONTRAST:  OMNIPAQUE IOHEXOL 300 MG/ML  SOLN  COMPARISON:  None.  FINDINGS: Lower chest: Curvilinear bilateral lower lobe scarring or atelectasis. Streak artifact from the patient's left arm obscures detail.  Hepatobiliary: Liver is unremarkable allowing for motion artifact and streak artifact. Gallbladder are unremarkable.  Pancreas: Normal  Spleen: Normal  Adrenals/Urinary Tract: Numerous bilateral renal cortical cysts are identified, largest right upper renal pole 3.4 cm image 15. No hydroureteronephrosis. Adrenal glands are unremarkable. No radiopaque renal or ureteral calculus. Bladder is unremarkable.  Stomach/Bowel: There is distal small bowel obstruction with the point of transition at the level of the terminal ileum as it traverses a complex fat and bowel containing ventral abdominal wall hernia. Example, image 50 series 2. Small bowel feces sign is evident. There is fat stranding  surrounding the loops of bowel within the hernia. The colon is decompressed. Normal appendix, image 58.  Vascular/Lymphatic: No aortic aneurysm.  No lymphadenopathy.  Other: No free air or fluid.  Musculoskeletal: Lower lumbar spine disc degenerative change. No acute osseous abnormality.  IMPRESSION: Distal small bowel obstruction the transition point at the level of the terminal ileum secondary to incarceration within anterior abdominal wall hernia. Stranding is noted surrounding loops of bowel within the hernia and this does confer a risk of ischemia.   Electronically Signed   By: Christiana Pellant M.D.   On: 09/13/2014 14:52   Dg Abd 2 Views  09/16/2014   CLINICAL DATA:  Partial small bowel obstruction.  Distended abdomen.  EXAM: ABDOMEN - 2 VIEW  COMPARISON:  09/14/2014  FINDINGS: Interval placement of enteric tube with tip just below the expected region of the gastroesophageal junction as this could be advanced 8-10 cm.  Continued evidence of multiple air-filled dilated small bowel loops with differential air-fluid levels. Small bowel measures up to 5.7 cm in diameter. Appearance slightly worse overall compared to the previous exam. Minimal air throughout the colon. No definite free peritoneal air. Remainder of the exam is unchanged.  IMPRESSION: Continued evidence of small bowel obstruction with slight overall interval worsening.  Enteric tube with tip just below the expected region of the gastroesophageal junction. This could be advanced another 8-10 cm.   Electronically Signed   By: Elberta Fortis M.D.   On: 09/16/2014 09:48   Dg Abd 2 Views  09/14/2014   CLINICAL DATA:  Abdominal distention, small bowel obstruction. Recurrent small bowel obstructions secondary to hernia repair  EXAM: ABDOMEN - 2 VIEW  COMPARISON:  09/13/2014 CT  FINDINGS: There multiple dilated loopsdilated loops of small bowel measuring up to 5 cm. The number of loops of dilated small bowel appears increased from CT topogram 1 day  prior. On the upright exam there are multiple short air-fluid levels within the small bowel. There is minimal gas within the colon. Small amount a gas in the rectum.  IMPRESSION: 1. No improvement in small bowel obstruction pattern. 2. Small  gas within the rectum. 3.  No free air.   Electronically Signed   By: Genevive Bi M.D.   On: 09/14/2014 10:16   Dg Abd Portable 1v  09/18/2014   CLINICAL DATA:  Small-bowel obstruction.  EXAM: PORTABLE ABDOMEN - 1 VIEW  COMPARISON:  09/16/2014.  FINDINGS: NG tube in stable position in the upper most portion of the stomach. Interim partial resolution of small bowel distention. Stool noted in colon. No free air. Pelvic calcifications consistent phleboliths. Degenerative change lumbar spine and both hips  IMPRESSION: Interim partial resolution of small bowel distention. Stool noted throughout the colon. No free air.   Electronically Signed   By: Maisie Fus  Register   On: 09/18/2014 09:52     CBC  Recent Labs Lab 09/13/14 1222 09/14/14 0528 09/17/14 0800  WBC 13.5* 4.2 9.4  HGB 16.5 14.8 16.0  HCT 48.9 44.9 47.2  PLT 194 191 159  MCV 85.8 87.4 85.8  MCH 28.9 28.8 29.1  MCHC 33.7 33.0 33.9  RDW 13.8 14.1 13.5    Chemistries   Recent Labs Lab 09/13/14 1222 09/14/14 0528 09/17/14 0800  NA 141 143 133*  K 3.9 4.0 3.5  CL 102 105 101  CO2 GLUCOSE 145* 118* 111*  BUN 21* 20 14  CREATININE 1.13 1.04 1.18  CALCIUM 10.1 8.7* 8.8*  MG  --   --  2.0  AST 28  --   --   ALT 25  --   --   ALKPHOS 70  --   --   BILITOT 1.1  --   --    ------------------------------------------------------------------------------------------------------------------ estimated creatinine clearance is 77.5 mL/min (by C-G formula based on Cr of 1.18). ------------------------------------------------------------------------------------------------------------------ No results for input(s): HGBA1C in the last 72  hours. ------------------------------------------------------------------------------------------------------------------ No results for input(s): CHOL, HDL, LDLCALC, TRIG, CHOLHDL, LDLDIRECT in the last 72 hours. ------------------------------------------------------------------------------------------------------------------ No results for input(s): TSH, T4TOTAL, T3FREE, THYROIDAB in the last 72 hours.  Invalid input(s): FREET3 ------------------------------------------------------------------------------------------------------------------ No results for input(s): VITAMINB12, FOLATE, FERRITIN, TIBC, IRON, RETICCTPCT in the last 72 hours.  Coagulation profile  Recent Labs Lab 09/17/14 1330  INR 1.12    No results for input(s): DDIMER in the last 72 hours.  Cardiac Enzymes  Recent Labs Lab 09/17/14 1330 09/17/14 2114 09/18/14 0238  TROPONINI 0.20* 0.14* 0.12*   ------------------------------------------------------------------------------------------------------------------ Invalid input(s): POCBNP     Time Spent in minutes   30 minutes   ELGERGAWY, DAWOOD M.D on 09/18/2014 at 12:08 PM  Between 7am to 7pm - Pager - [phone]  After 7pm go to [email or web address] - password [facility]  [facility]   Office  [phone]

## 2014-09-18 NOTE — Progress Notes (Signed)
Date:  September 18, 2014 U.R. performed for needs and level of care. Pulmonary P.E. And abnormal ABG's with o2 content of 78% on 3.0% o2. o2 increased and iv heparin started. Will continue to follow for Case Management needs.  Marcelle Smiling, RN, BSN, [place]   [phone]

## 2014-09-19 LAB — LUPUS ANTICOAGULANT PANEL
DRVVT: 44.9 s (ref 0.0–55.1)
PTT LA: 45.1 s (ref 0.0–50.0)

## 2014-09-19 LAB — HEPARIN LEVEL (UNFRACTIONATED)
HEPARIN UNFRACTIONATED: 0.8 [IU]/mL — AB (ref 0.30–0.70)
HEPARIN UNFRACTIONATED: 0.49 [IU]/mL (ref 0.30–0.70)
HEPARIN UNFRACTIONATED: 0.48 [IU]/mL (ref 0.30–0.70)

## 2014-09-19 LAB — CBC
HCT: 41.6 % (ref 39.0–52.0)
Hemoglobin: 13.7 g/dL (ref 13.0–17.0)
MCH: 28.7 pg (ref 26.0–34.0)
MCHC: 32.9 g/dL (ref 30.0–36.0)
MCV: 87 fL (ref 78.0–100.0)
PLATELETS: 182 10*3/uL (ref 150–400)
RBC: 4.78 MIL/uL (ref 4.22–5.81)
RDW: 13.9 % (ref 11.5–15.5)
WBC: 12.3 10*3/uL — ABNORMAL HIGH (ref 4.0–10.5)

## 2014-09-19 LAB — PROTEIN C ACTIVITY: PROTEIN C ACTIVITY: 104 % (ref 74–151)

## 2014-09-19 LAB — BASIC METABOLIC PANEL
Anion gap: 12 (ref 5–15)
BUN: 21 mg/dL — ABNORMAL HIGH (ref 6–20)
CALCIUM: 9 mg/dL (ref 8.9–10.3)
CO2: 27 mmol/L (ref 22–32)
Chloride: 104 mmol/L (ref 101–111)
Creatinine, Ser: 0.94 mg/dL (ref 0.61–1.24)
GLUCOSE: 88 mg/dL (ref 65–99)
POTASSIUM: 3.3 mmol/L — AB (ref 3.5–5.1)
Sodium: 143 mmol/L (ref 135–145)

## 2014-09-19 LAB — BETA-2-GLYCOPROTEIN I ABS, IGG/M/A: Beta-2-Glycoprotein I IgM: <9 GPI IgM units (ref 0–32)

## 2014-09-19 LAB — PROTEIN S, TOTAL: Protein S Ag, Total: 133 % (ref 58–150)

## 2014-09-19 LAB — PROTEIN S ACTIVITY: Protein S Activity: 88 % (ref 60–145)

## 2014-09-19 MED ORDER — POTASSIUM CHLORIDE 20 MEQ/15ML (10%) PO SOLN
30.0000 meq | ORAL | Status: AC
  Administered 2014-09-19 (×2): 30 meq via ORAL
  Filled 2014-09-19 (×2): qty 30

## 2014-09-19 MED ORDER — SODIUM CHLORIDE 0.9 % IV SOLN
INTRAVENOUS | Status: AC
  Administered 2014-09-19: 14:00:00 via INTRAVENOUS

## 2014-09-19 MED ORDER — POTASSIUM CHLORIDE CRYS ER 20 MEQ PO TBCR
30.0000 meq | EXTENDED_RELEASE_TABLET | ORAL | Status: DC
  Filled 2014-09-19 (×2): qty 1

## 2014-09-19 MED ORDER — METOPROLOL TARTRATE 1 MG/ML IV SOLN
5.0000 mg | INTRAVENOUS | Status: DC | PRN
  Administered 2014-09-19 (×2): 5 mg via INTRAVENOUS
  Filled 2014-09-19 (×2): qty 5

## 2014-09-19 MED ORDER — METOPROLOL SUCCINATE ER 25 MG PO TB24
50.0000 mg | ORAL_TABLET | Freq: Every day | ORAL | Status: DC
  Administered 2014-09-19 – 2014-09-20 (×2): 50 mg via ORAL
  Filled 2014-09-19 (×2): qty 2

## 2014-09-19 MED ORDER — HEPARIN (PORCINE) IN NACL 100-0.45 UNIT/ML-% IJ SOLN
1550.0000 [IU]/h | INTRAMUSCULAR | Status: AC
  Administered 2014-09-19: 1550 [IU]/h via INTRAVENOUS
  Filled 2014-09-19 (×3): qty 250

## 2014-09-19 MED ORDER — POTASSIUM CHLORIDE CRYS ER 20 MEQ PO TBCR: 20.0000 meq | EXTENDED_RELEASE_TABLET | Freq: Once | ORAL | Status: DC

## 2014-09-19 NOTE — Progress Notes (Signed)
Patient ID: Jesus [name], male   DOB: 26-Jan-1946, 69 y.o.   MRN: [id number]     [address] [place], [place] [address]    Phone: [phone] FAX: [phone]     Subjective: Denies sob, cp.  Denies abdominal pain.  Had a bm yesterday, soft.  Tolerated clears.    Objective:  Vital signs:  Filed Vitals:   09/19/14 0335 09/19/14 0400 09/19/14 0618 09/19/14 0639  BP:  134/97  156/91  Pulse:      Temp: 98.4 F (36.9 C)  97.9 F (36.6 C)   TempSrc: Oral  Oral   Resp:  23    Height:      Weight:      SpO2:  94%      Last BM Date: 09/17/14  Intake/Output   Yesterday:  06/23 0701 - 06/24 0700 In: 358 [I.V.:198; NG/GT:40] Out: 785 [Urine:785] This shift: I/O last 3 completed shifts: In: 649.5 [I.V.:379.5; Other:230; NG/GT:40] Out: 2065 [Urine:1465; Emesis/NG output:600]    Physical Exam: General: Pt awake/alert/oriented x4 in no acute distress Chest: cta.  No chest wall pain w good excursion CV:  S1S2 rrr, tachycardic. No murmurs, gallops or rubs.  MS: Normal AROM mjr joints.  No obvious deformity Abdomen: Soft.  Nondistended.  Reducible ventral hernia.  No evidence of peritonitis.  No incarcerated hernias. Ext:  SCDs BLE.  No mjr edema.  No cyanosis Skin: No petechiae / purpura   Problem List:   Principal Problem:   Small bowel obstruction Active Problems:   Acute pulmonary embolism   Malignant hypertension   Tachycardia   Elevated troponin    Results:   Labs: Results for orders placed or performed during the hospital encounter of 09/13/14 (from the past 48 hour(s))  Antithrombin III     Status: None   Collection Time: 09/17/14  1:30 PM  Result Value Ref Range   AntiThromb III Func 101 75 - 120 %    Comment: Performed at [facility]  Beta-2-glycoprotein i abs, IgG/M/A     Status: None   Collection Time: 09/17/14  1:30 PM  Result Value Ref Range   Beta-2 Glyco I IgG <9 0 - 20  GPI IgG units    Comment: (NOTE) The reference interval reflects a 3SD or 99th percentile interval, which is thought to represent a potentially clinically significant result in accordance with the International Consensus Statement on the classification criteria for definitive antiphospholipid syndrome (APS). J Thromb Haem 2006;4:295-306.    Beta-2-Glycoprotein I IgM <9 0 - 32 GPI IgM units    Comment: (NOTE) The reference interval reflects a 3SD or 99th percentile interval, which is thought to represent a potentially clinically significant result in accordance with the International Consensus Statement on the classification criteria for definitive antiphospholipid syndrome (APS). J Thromb Haem 2006;4:295-306. Performed At: [facility] [place], [place] [id number] Lindon Romp MD [id number]    Beta-2-Glycoprotein I IgA <9 0 - 25 GPI IgA units    Comment: (NOTE) The reference interval reflects a 3SD or 99th percentile interval, which is thought to represent a potentially clinically significant result in accordance with the International Consensus Statement on the classification criteria for definitive antiphospholipid syndrome (APS). J Thromb Haem 2006;4:295-306.   Cardiolipin antibodies, IgG, IgM, IgA     Status: None   Collection Time: 09/17/14  1:30 PM  Result Value Ref Range   Anticardiolipin IgG <  9 0 - 14 GPL U/mL    Comment: (NOTE)                          Negative:              <15                          Indeterminate:     15 - 20                          Low-Med Positive: >20 - 80                          High Positive:         >80    Anticardiolipin IgM <9 0 - 12 MPL U/mL    Comment: (NOTE)                          Negative:              <13                          Indeterminate:     13 - 20                          Low-Med Positive: >20 - 80                          High Positive:         >80    Anticardiolipin IgA <9 0 - 11 APL  U/mL    Comment: (NOTE)                          Negative:              <12                          Indeterminate:     12 - 20                          Low-Med Positive: >20 - 80                          High Positive:         >80 Performed At: [facility] [address] [place], [place] [id number] Lindon Romp MD [id number]:[id number]   Homocysteine, serum     Status: Abnormal   Collection Time: 09/17/14  1:30 PM  Result Value Ref Range   Homocysteine 15.2 (H) 0.0 - 15.0 umol/L    Comment: (NOTE) Performed At: [facility] [place], [place] [id number] Lindon Romp MD [id number]   Lupus anticoagulant panel     Status: None   Collection Time: 09/17/14  1:30 PM  Result Value Ref Range   PTT Lupus Anticoagulant 45.1 0.0 - 50.0 sec   DRVVT 44.9 0.0 - 55.1 sec   Lupus Anticoag Interp Comment:     Comment: (NOTE) No lupus anticoagulant was detected. Performed At: [facility] [address] [place], [place] [id number]  Lindon Romp MD UX:[id number]   Protein C activity     Status: None   Collection Time: 09/17/14  1:30 PM  Result Value Ref Range   Protein C Activity 104 74 - 151 %    Comment: (NOTE) Performed At: [facility] [place], [place] [id number] Lindon Romp MD [id number]   Protein S activity     Status: None   Collection Time: 09/17/14  1:30 PM  Result Value Ref Range   Protein S Activity 88 60 - 145 %    Comment: (NOTE) Performed At: [facility] [address] [place], [place] [id number] Lindon Romp MD [id number]   Protein S, total     Status: None   Collection Time: 09/17/14  1:30 PM  Result Value Ref Range   Protein S Ag, Total 133 58 - 150 %    Comment: (NOTE) Performed At: [facility] [address] [place], [place] [id number] Lindon Romp MD [id number]   APTT     Status: None   Collection Time: 09/17/14  1:30 PM  Result Value Ref Range    aPTT 31 24 - 37 seconds  Protime-INR     Status: None   Collection Time: 09/17/14  1:30 PM  Result Value Ref Range   Prothrombin Time 14.6 11.6 - 15.2 seconds   INR 1.12 0.00 - 1.49  Troponin I (q 6hr x 3)     Status: Abnormal   Collection Time: 09/17/14  1:30 PM  Result Value Ref Range   Troponin I 0.20 (H) <0.031 ng/mL    Comment:        PERSISTENTLY INCREASED TROPONIN VALUES IN THE RANGE OF 0.04-0.49 ng/mL CAN BE SEEN IN:       -UNSTABLE ANGINA       -CONGESTIVE HEART FAILURE       -MYOCARDITIS       -CHEST TRAUMA       -ARRYHTHMIAS       -LATE PRESENTING MYOCARDIAL INFARCTION       -COPD   CLINICAL FOLLOW-UP RECOMMENDED.   Blood gas, arterial     Status: Abnormal   Collection Time: 09/17/14  2:52 PM  Result Value Ref Range   O2 Content 3.0 L/min   Delivery systems NASAL CANNULA    pH, Arterial 7.463 (H) 7.350 - 7.450   pCO2 arterial 29.1 (L) 35.0 - 45.0 mmHg   pO2, Arterial 78.2 (L) 80.0 - 100.0 mmHg   Bicarbonate 20.6 20.0 - 24.0 mEq/L   TCO2 17.2 0 - 100 mmol/L   Acid-base deficit 1.4 0.0 - 2.0 mmol/L   O2 Saturation 95.4 %   Patient temperature 98.6    Collection site LEFT RADIAL    Drawn by [id number]    Sample type ARTERIAL DRAW    Allens test (pass/fail) PASS PASS  Heparin level (unfractionated)     Status: None   Collection Time: 09/17/14  9:14 PM  Result Value Ref Range   Heparin Unfractionated 0.63 0.30 - 0.70 IU/mL    Comment:        IF HEPARIN RESULTS ARE BELOW EXPECTED VALUES, AND PATIENT DOSAGE HAS BEEN CONFIRMED, SUGGEST FOLLOW UP TESTING OF ANTITHROMBIN III LEVELS.   Troponin I (q 6hr x 3)     Status: Abnormal   Collection Time: 09/17/14  9:14 PM  Result Value Ref Range   Troponin I 0.14 (H) <0.031 ng/mL    Comment:        PERSISTENTLY  INCREASED TROPONIN VALUES IN THE RANGE OF 0.04-0.49 ng/mL CAN BE SEEN IN:       -UNSTABLE ANGINA       -CONGESTIVE HEART FAILURE       -MYOCARDITIS       -CHEST TRAUMA       -ARRYHTHMIAS       -LATE  PRESENTING MYOCARDIAL INFARCTION       -COPD   CLINICAL FOLLOW-UP RECOMMENDED.   Troponin I (q 6hr x 3)     Status: Abnormal   Collection Time: 09/18/14  2:38 AM  Result Value Ref Range   Troponin I 0.12 (H) <0.031 ng/mL    Comment:        PERSISTENTLY INCREASED TROPONIN VALUES IN THE RANGE OF 0.04-0.49 ng/mL CAN BE SEEN IN:       -UNSTABLE ANGINA       -CONGESTIVE HEART FAILURE       -MYOCARDITIS       -CHEST TRAUMA       -ARRYHTHMIAS       -LATE PRESENTING MYOCARDIAL INFARCTION       -COPD   CLINICAL FOLLOW-UP RECOMMENDED.   Heparin level (unfractionated)     Status: None   Collection Time: 09/18/14  3:45 AM  Result Value Ref Range   Heparin Unfractionated 0.60 0.30 - 0.70 IU/mL    Comment:        IF HEPARIN RESULTS ARE BELOW EXPECTED VALUES, AND PATIENT DOSAGE HAS BEEN CONFIRMED, SUGGEST FOLLOW UP TESTING OF ANTITHROMBIN III LEVELS.   CBC     Status: Abnormal   Collection Time: 09/19/14  3:35 AM  Result Value Ref Range   WBC 12.3 (H) 4.0 - 10.5 K/uL   RBC 4.78 4.22 - 5.81 MIL/uL   Hemoglobin 13.7 13.0 - 17.0 g/dL   HCT 41.6 39.0 - 52.0 %   MCV 87.0 78.0 - 100.0 fL   MCH 28.7 26.0 - 34.0 pg   MCHC 32.9 30.0 - 36.0 g/dL   RDW 13.9 11.5 - 15.5 %   Platelets 182 150 - 400 K/uL  Basic metabolic panel     Status: Abnormal   Collection Time: 09/19/14  3:35 AM  Result Value Ref Range   Sodium 143 135 - 145 mmol/L    Comment: DELTA CHECK NOTED REPEATED TO VERIFY    Potassium 3.3 (L) 3.5 - 5.1 mmol/L   Chloride 104 101 - 111 mmol/L   CO2 27 22 - 32 mmol/L   Glucose, Bld 88 65 - 99 mg/dL   BUN 21 (H) 6 - 20 mg/dL   Creatinine, Ser 0.94 0.61 - 1.24 mg/dL   Calcium 9.0 8.9 - 10.3 mg/dL   GFR calc non Af Amer >60 >60 mL/min   GFR calc Af Amer >60 >60 mL/min    Comment: (NOTE) The eGFR has been calculated using the CKD EPI equation. This calculation has not been validated in all clinical situations. eGFR's persistently <60 mL/min signify possible Chronic  Kidney Disease.    Anion gap 12 5 - 15    Imaging / Studies: Ct Angio Chest Pe W/cm &/or Wo Cm  09/17/2014   CLINICAL DATA:  Shortness of breath, tachycardia.  EXAM: CT ANGIOGRAPHY CHEST WITH CONTRAST  TECHNIQUE: Multidetector CT imaging of the chest was performed using the standard protocol during bolus administration of intravenous contrast. Multiplanar CT image reconstructions and MIPs were obtained to evaluate the vascular anatomy.  CONTRAST:  175m OMNIPAQUE IOHEXOL 350 MG/ML SOLN  COMPARISON:  None.  FINDINGS: Filling defects noted in the right lower lobe pulmonary artery compatible with acute pulmonary embolus. Pulmonary embolus also likely extends into the right upper lobe pulmonary artery. No pulmonary emboli on the left.  Heart is normal size. Aorta is normal caliber. No mediastinal, hilar, or axillary adenopathy. Chest wall soft tissues are unremarkable.  Linear areas of atelectasis in the left lower lobe and right middle lobe. No pleural effusions.  NG tube is in place with the tip in the stomach.  Review of the MIP images confirms the above findings.  IMPRESSION: Small to medium sized pulmonary emboli noted in the right upper lobe and right lower lobe pulmonary arteries.  Areas of atelectasis in the left lower lobe and right middle lobe.  NG tube in the stomach.  These results will be called to the ordering clinician or representative by the Radiologist Assistant, and communication documented in the PACS or zVision Dashboard.   Electronically Signed   By: Rolm Baptise M.D.   On: 09/17/2014 12:25   Dg Abd Portable 1v  09/18/2014   CLINICAL DATA:  Small-bowel obstruction.  EXAM: PORTABLE ABDOMEN - 1 VIEW  COMPARISON:  09/16/2014.  FINDINGS: NG tube in stable position in the upper most portion of the stomach. Interim partial resolution of small bowel distention. Stool noted in colon. No free air. Pelvic calcifications consistent phleboliths. Degenerative change lumbar spine and both hips   IMPRESSION: Interim partial resolution of small bowel distention. Stool noted throughout the colon. No free air.   Electronically Signed   By: Marcello Moores  Register   On: 09/18/2014 09:52    Medications / Allergies:  Scheduled Meds: . labetalol  20 mg Intravenous Once  . LORazepam  1 mg Intravenous QHS  . metoprolol succinate  50 mg Oral Daily  . potassium chloride  30 mEq Oral Q4H   Continuous Infusions: . sodium chloride 100 mL/hr at 09/19/14 0745  . heparin 1,650 Units/hr (09/19/14 0745)   PRN Meds:.acetaminophen, hydrALAZINE, HYDROcodone-acetaminophen, menthol-cetylpyridinium, morphine injection, ondansetron, phenol, sodium chloride  Antibiotics: Anti-infectives    Start     Dose/Rate Route Frequency Ordered Stop   09/17/14 0600  cefOXitin (MEFOXIN) 2 g in dextrose 5 % 50 mL IVPB  Status:  Discontinued     2 g 100 mL/hr over 30 Minutes Intravenous On call to O.R. 09/16/14 1206 09/16/14 1323   09/17/14 0600  ceFAZolin (ANCEF) IVPB 2 g/50 mL premix  Status:  Discontinued     2 g 100 mL/hr over 30 Minutes Intravenous  Once 09/16/14 1319 09/17/14 1510        Assessment/Plan HD#6 SBO 2/2 ventral hernia-resolved, hernia now reducible.  No surgery needed.  Ideally would like to wait 4-6 weeks given new PE diagnosis. Right PEs-remains tachycardic, stable resp.  Plan to transition to Eliquis.  Would need a lovenox bridge if surgery is needed or IVC filter for urgent sugery. CV-tachycardic.  Replenish K, IVF and metoprolol increased.  Appreciate medicine management FEN-DC NGT and advance to fulls Dispo-continue ICU.    PCP--Dr. Cherie Ouch, [facility] Surgery Pager [phone](7A-[phone])   09/19/2014 8:10 AM

## 2014-09-19 NOTE — Progress Notes (Signed)
Patient Demographics  Jesus [name], is a 69 y.o. male, DOB - [birth year], ZOX:[id number]  Admit date - 09/13/2014   Admitting Physician Ovidio Kin, MD  Outpatient Primary MD for the patient is Karie Chimera, MD  LOS - 4                                     Consult Follow up   Chief Complaint  Patient presents with  . Vomiting       Admission HPI/Brief narrative: 69 year old male admitted with partial small bowel obstruction secondary to incarcerated umbilical hernia, develop tachycardia, hypoxia, CT chest angiogram significant for PE on 6/22, started on heparin drip, partial small obstruction appears to be resolving clinically, currently tolerating clear liquid diet.  Subjective:   Jesus [name] today has, No headache, No chest pain, No abdominal pain - No Nausea, No new weakness tingling or numbness, No Cough - SOB.  reports he has been passing gas, had bowel movement yesterday, tolerating clear liquid.  Assessment & Plan    Principal Problem:   Small bowel obstruction Active Problems:   Acute pulmonary embolism   Malignant hypertension   Tachycardia   Elevated troponin   small bowel obstruction secondary to ventral hernia  - Appears to be clinically improving , patient tolerating clear liquid diet, NG tube discontinued.  Pulmonary embolism  - Currently patient on IV heparin gtt, remains tachycardic, hypoxic , 2-D echo done, but tried ventricle not well seen. - If no urgent surgery is indicated (which seems to be the case here as his SBO is improving) ,ideally would keep patient on anticoagulation at least for 6 weeks  prior to proceeding with surgery If no urgent surgery is indicated (which seems to be the case here as his SBO is improving)  , then we'll change to oral Eliquis for anticoagulation , which can be transitioned to Lovenox 1 week prior to surgery as outpatient, but ifthere is an  indication for emergent surgery , patient could be anticoagulated 4-5 days on IV heparin gtt prior  to surgery. - Follow on hypercoagulable workup  - Venous Doppler negative for DVT   Hypertension -Remains elevated, increase Toprol-XL from 25 to 50 mg oral daily, especially patient is tachycardic, continue with when necessary hydralazine.  Sinus tachycardia - In the setting of acute PE, possible volume depletion, increased metoprolol dose, started on IV fluid.   elevated troponin  - Cardiac consult greatly appreciated , most likely demand ischemia , no indication for further workup .    Code Status: Full   Family Communication: none at bedside     Procedures  none      Medications  Scheduled Meds: . labetalol  20 mg Intravenous Once  . LORazepam  1 mg Intravenous QHS  . metoprolol succinate  50 mg Oral Daily  . potassium chloride  30 mEq Oral Q4H   Continuous Infusions: . sodium chloride 100 mL/hr at 09/19/14 0745  . heparin 1,650 Units/hr (09/19/14 0745)   PRN Meds:.acetaminophen, hydrALAZINE, HYDROcodone-acetaminophen, menthol-cetylpyridinium, morphine injection, ondansetron, phenol, sodium chloride  DVT Prophylaxis  On heaprin gtt  Lab Results  Component Value Date   PLT 182  09/19/2014    Antibiotics    Anti-infectives    Start     Dose/Rate Route Frequency Ordered Stop   09/17/14 0600  cefOXitin (MEFOXIN) 2 g in dextrose 5 % 50 mL IVPB  Status:  Discontinued     2 g 100 mL/hr over 30 Minutes Intravenous On call to O.R. 09/16/14 1206 09/16/14 1323   09/17/14 0600  ceFAZolin (ANCEF) IVPB 2 g/50 mL premix  Status:  Discontinued     2 g 100 mL/hr over 30 Minutes Intravenous  Once 09/16/14 1319 09/17/14 1510          Objective:   Filed Vitals:   09/19/14 0400 09/19/14 0618 09/19/14 0639 09/19/14 0800  BP: 134/97  156/91   Pulse:      Temp:  97.9 F (36.6 C)  98.8 F (37.1 C)  TempSrc:  Oral  Oral  Resp: 23     Height:      Weight:       SpO2: 94%       Wt Readings from Last 3 Encounters:  09/17/14 109.6 kg (241 lb 10 oz)  11/16/11 108.863 kg (240 lb)  06/13/11 108.863 kg (240 lb)     Intake/Output Summary (Last 24 hours) at 09/19/14 0905 Last data filed at 09/19/14 0600  Gross per 24 hour  Intake  258.5 ml  Output    660 ml  Net -401.5 ml     Physical Exam  Awake Alert, Oriented X 3, No new F.N deficits, Normal affect Pulcifer.AT,PERRAL Supple Neck,No JVD, No cervical lymphadenopathy appriciated.  Symmetrical Chest wall movement, Good air movement bilaterally, CTAB Tachycardic,No Gallops,Rubs or new Murmurs, No Parasternal Heave +ve B.Sounds, Abd Soft, No tenderness, No organomegaly appriciated, No rebound - guarding or rigidity. hernia is reduced. No Cyanosis, Clubbing or edema, No new Rash or bruise     Data Review   Micro Results Recent Results (from the past 240 hour(s))  MRSA PCR Screening     Status: None   Collection Time: 09/15/14  9:58 AM  Result Value Ref Range Status   MRSA by PCR NEGATIVE NEGATIVE Final    Comment:        The GeneXpert MRSA Assay (FDA approved for NASAL specimens only), is one component of a comprehensive MRSA colonization surveillance program. It is not intended to diagnose MRSA infection nor to guide or monitor treatment for MRSA infections.     Radiology Reports Ct Angio Chest Pe W/cm &/or Wo Cm  09/17/2014   CLINICAL DATA:  Shortness of breath, tachycardia.  EXAM: CT ANGIOGRAPHY CHEST WITH CONTRAST  TECHNIQUE: Multidetector CT imaging of the chest was performed using the standard protocol during bolus administration of intravenous contrast. Multiplanar CT image reconstructions and MIPs were obtained to evaluate the vascular anatomy.  CONTRAST:  OMNIPAQUE IOHEXOL 350 MG/ML SOLN  COMPARISON:  None.  FINDINGS: Filling defects noted in the right lower lobe pulmonary artery compatible with acute pulmonary embolus. Pulmonary embolus also likely extends into the right  upper lobe pulmonary artery. No pulmonary emboli on the left.  Heart is normal size. Aorta is normal caliber. No mediastinal, hilar, or axillary adenopathy. Chest wall soft tissues are unremarkable.  Linear areas of atelectasis in the left lower lobe and right middle lobe. No pleural effusions.  NG tube is in place with the tip in the stomach.  Review of the MIP images confirms the above findings.  IMPRESSION: Small to medium sized pulmonary emboli noted in the right upper lobe  and right lower lobe pulmonary arteries.  Areas of atelectasis in the left lower lobe and right middle lobe.  NG tube in the stomach.  These results will be called to the ordering clinician or representative by the Radiologist Assistant, and communication documented in the PACS or zVision Dashboard.   Electronically Signed   By: Charlett Nose M.D.   On: 09/17/2014 12:25   Ct Abdomen Pelvis W Contrast  09/13/2014   CLINICAL DATA:  Vomiting, abdominal pain  EXAM: CT ABDOMEN AND PELVIS WITH CONTRAST  TECHNIQUE: Multidetector CT imaging of the abdomen and pelvis was performed using the standard protocol following bolus administration of intravenous contrast.  CONTRAST:  OMNIPAQUE IOHEXOL 300 MG/ML  SOLN  COMPARISON:  None.  FINDINGS: Lower chest: Curvilinear bilateral lower lobe scarring or atelectasis. Streak artifact from the patient's left arm obscures detail.  Hepatobiliary: Liver is unremarkable allowing for motion artifact and streak artifact. Gallbladder are unremarkable.  Pancreas: Normal  Spleen: Normal  Adrenals/Urinary Tract: Numerous bilateral renal cortical cysts are identified, largest right upper renal pole 3.4 cm image 15. No hydroureteronephrosis. Adrenal glands are unremarkable. No radiopaque renal or ureteral calculus. Bladder is unremarkable.  Stomach/Bowel: There is distal small bowel obstruction with the point of transition at the level of the terminal ileum as it traverses a complex fat and bowel containing ventral  abdominal wall hernia. Example, image 50 series 2. Small bowel feces sign is evident. There is fat stranding surrounding the loops of bowel within the hernia. The colon is decompressed. Normal appendix, image 58.  Vascular/Lymphatic: No aortic aneurysm.  No lymphadenopathy.  Other: No free air or fluid.  Musculoskeletal: Lower lumbar spine disc degenerative change. No acute osseous abnormality.  IMPRESSION: Distal small bowel obstruction the transition point at the level of the terminal ileum secondary to incarceration within anterior abdominal wall hernia. Stranding is noted surrounding loops of bowel within the hernia and this does confer a risk of ischemia.   Electronically Signed   By: Christiana Pellant M.D.   On: 09/13/2014 14:52   Dg Abd 2 Views  09/16/2014   CLINICAL DATA:  Partial small bowel obstruction.  Distended abdomen.  EXAM: ABDOMEN - 2 VIEW  COMPARISON:  09/14/2014  FINDINGS: Interval placement of enteric tube with tip just below the expected region of the gastroesophageal junction as this could be advanced 8-10 cm.  Continued evidence of multiple air-filled dilated small bowel loops with differential air-fluid levels. Small bowel measures up to 5.7 cm in diameter. Appearance slightly worse overall compared to the previous exam. Minimal air throughout the colon. No definite free peritoneal air. Remainder of the exam is unchanged.  IMPRESSION: Continued evidence of small bowel obstruction with slight overall interval worsening.  Enteric tube with tip just below the expected region of the gastroesophageal junction. This could be advanced another 8-10 cm.   Electronically Signed   By: Elberta Fortis M.D.   On: 09/16/2014 09:48   Dg Abd 2 Views  09/14/2014   CLINICAL DATA:  Abdominal distention, small bowel obstruction. Recurrent small bowel obstructions secondary to hernia repair  EXAM: ABDOMEN - 2 VIEW  COMPARISON:  09/13/2014 CT  FINDINGS: There multiple dilated loopsdilated loops of small bowel  measuring up to 5 cm. The number of loops of dilated small bowel appears increased from CT topogram 1 day prior. On the upright exam there are multiple short air-fluid levels within the small bowel. There is minimal gas within the colon. Small amount a gas in  the rectum.  IMPRESSION: 1. No improvement in small bowel obstruction pattern. 2. Small  gas within the rectum. 3. No free air.   Electronically Signed   By: Genevive Bi M.D.   On: 09/14/2014 10:16   Dg Abd Portable 1v  09/18/2014   CLINICAL DATA:  Small-bowel obstruction.  EXAM: PORTABLE ABDOMEN - 1 VIEW  COMPARISON:  09/16/2014.  FINDINGS: NG tube in stable position in the upper most portion of the stomach. Interim partial resolution of small bowel distention. Stool noted in colon. No free air. Pelvic calcifications consistent phleboliths. Degenerative change lumbar spine and both hips  IMPRESSION: Interim partial resolution of small bowel distention. Stool noted throughout the colon. No free air.   Electronically Signed   By: Maisie Fus  Register   On: 09/18/2014 09:52     CBC  Recent Labs Lab 09/13/14 1222 09/14/14 0528 09/17/14 0800 09/19/14 0335  WBC 13.5* 4.2 9.4 12.3*  HGB 16.5 14.8 16.0 13.7  HCT 48.9 44.9 47.2 41.6  PLT 194 191 159 182  MCV 85.8 87.4 85.8 87.0  MCH 28.9 28.8 29.1 28.7  MCHC 33.7 33.0 33.9 32.9  RDW 13.8 14.1 13.5 13.9    Chemistries   Recent Labs Lab 09/13/14 1222 09/14/14 0528 09/17/14 0800 09/19/14 0335  NA 141 143 133* 143  K 3.9 4.0 3.5 3.3*  CL 102 105 101 104  CO2 28 29 26 27   GLUCOSE 145* 118* 111* 88  BUN 21* 20 14 21*  CREATININE 1.13 1.04 1.18 0.94  CALCIUM 10.1 8.7* 8.8* 9.0  MG  --   --  2.0  --   AST 28  --   --   --   ALT 25  --   --   --   ALKPHOS 70  --   --   --   BILITOT 1.1  --   --   --    ------------------------------------------------------------------------------------------------------------------ estimated creatinine clearance is 97.3 mL/min (by C-G formula  based on Cr of 0.94). ------------------------------------------------------------------------------------------------------------------ No results for input(s): HGBA1C in the last 72 hours. ------------------------------------------------------------------------------------------------------------------ No results for input(s): CHOL, HDL, LDLCALC, TRIG, CHOLHDL, LDLDIRECT in the last 72 hours. ------------------------------------------------------------------------------------------------------------------ No results for input(s): TSH, T4TOTAL, T3FREE, THYROIDAB in the last 72 hours.  Invalid input(s): FREET3 ------------------------------------------------------------------------------------------------------------------ No results for input(s): VITAMINB12, FOLATE, FERRITIN, TIBC, IRON, RETICCTPCT in the last 72 hours.  Coagulation profile  Recent Labs Lab 09/17/14 1330  INR 1.12    No results for input(s): DDIMER in the last 72 hours.  Cardiac Enzymes  Recent Labs Lab 09/17/14 1330 09/17/14 2114 09/18/14 0238  TROPONINI 0.20* 0.14* 0.12*   ------------------------------------------------------------------------------------------------------------------ Invalid input(s): POCBNP     Time Spent in minutes   30 minutes   Mithra Spano M.D on 09/19/2014 at 9:05 AM  Between 7am to 7pm - Pager - [phone]  After 7pm go to [email or web address] - password [facility]  [facility]   Office  [phone]

## 2014-09-19 NOTE — Progress Notes (Signed)
ANTICOAGULATION CONSULT NOTE - Follow up Consult  Pharmacy Consult for Heparin Indication: pulmonary embolus  No Known Allergies  Patient Measurements: Height: 5\' 11"  (180.3 cm) Weight: 241 lb 10 oz (109.6 kg) IBW/kg (Calculated) : 75.3 Heparin Dosing Weight: 99 kg  Vital Signs: Temp: 97.9 F (36.6 C) (06/24 0618) Temp Source: Oral (06/24 0618) BP: 156/91 mmHg (06/24 0639)  Labs:  Recent Labs  09/17/14 0800 09/17/14 1330 09/17/14 2114 09/18/14 0238 09/18/14 0345 09/19/14 0335  HGB 16.0  --   --   --   --  13.7  HCT 47.2  --   --   --   --  41.6  PLT 159  --   --   --   --  182  APTT  --  31  --   --   --   --   LABPROT  --  14.6  --   --   --   --   INR  --  1.12  --   --   --   --   HEPARINUNFRC  --   --  0.63  --  0.60  --   CREATININE 1.18  --   --   --   --  0.94  TROPONINI  --  0.20* 0.14* 0.12*  --   --     Estimated Creatinine Clearance: 97.3 mL/min (by C-G formula based on Cr of 0.94).   Medications  Infusions:  . sodium chloride    . heparin 1,650 Units/hr (09/18/14 1930)    Assessment: 40 yoM admitted 6/18 for an incarcerated umbilical hernia with partial small bowel obstruction. NG tube was placed on 6/20 for decompression and being managed conservatively. Noted with hypoxia overnight 6/21, uncontrolled HTN and tachycardia 6/22 AM. CT reveals double PE in RL. Patient has been on VTE ppx with SQ heparin since admission; appears unprovoked. Pharmacy is consulted to dose Heparin IV.  Today, 09/19/2014:  Heparin level:  0.8, above goal range  No bleeding or complications reported by RN or documented in chart.  CBC: Hgb 13.7 (decreased, but remains WNL), Plt 182  Renal: SCr 0.94 is improved with CrCl ~ 97 ml/min CG  Goal of Therapy:  Heparin level 0.3-0.7 units/ml Monitor platelets by anticoagulation protocol: Yes   Plan:   Reduce to heparin IV infusion at 1550 units/hr  Heparin level 6 hours rate change  Daily heparin level and  CBC  Continue to monitor H&H and platelets  Lynann Beaver PharmD, BCPS Pager [phone] 09/19/2014 7:08 AM

## 2014-09-19 NOTE — Progress Notes (Signed)
ANTICOAGULATION CONSULT NOTE - Follow up Consult  Pharmacy Consult for Heparin Indication: pulmonary embolus  No Known Allergies  Patient Measurements: Height: 5\' 11"  (180.3 cm) Weight: 241 lb 10 oz (109.6 kg) IBW/kg (Calculated) : 75.3 Heparin Dosing Weight: 99 kg  Vital Signs: Temp: 99 F (37.2 C) (06/24 1600) Temp Source: Oral (06/24 1600) BP: 159/105 mmHg (06/24 1500)  Labs:  Recent Labs  09/17/14 0800 09/17/14 1330  09/17/14 2114 09/18/14 0238 09/18/14 0345 09/19/14 0335 09/19/14 0825 09/19/14 1615  HGB 16.0  --   --   --   --   --  13.7  --   --   HCT 47.2  --   --   --   --   --  41.6  --   --   PLT 159  --   --   --   --   --  182  --   --   APTT  --  31  --   --   --   --   --   --   --   LABPROT  --  14.6  --   --   --   --   --   --   --   INR  --  1.12  --   --   --   --   --   --   --   HEPARINUNFRC  --   --   < > 0.63  --  0.60  --  0.80* 0.49  CREATININE 1.18  --   --   --   --   --  0.94  --   --   TROPONINI  --  0.20*  --  0.14* 0.12*  --   --   --   --   < > = values in this interval not displayed.  Estimated Creatinine Clearance: 97.3 mL/min (by C-G formula based on Cr of 0.94).   Medications  Infusions:  . sodium chloride 100 mL/hr at 09/19/14 1401  . heparin 1,550 Units/hr (09/19/14 1000)    Assessment: 54 yoM admitted 6/18 for an incarcerated umbilical hernia with partial small bowel obstruction. NG tube was placed on 6/20 for decompression and being managed conservatively. Noted with hypoxia overnight 6/21, uncontrolled HTN and tachycardia 6/22 AM. CT reveals double PE in RL. Patient has been on VTE ppx with SQ heparin since admission; appears unprovoked. Pharmacy is consulted to dose Heparin IV.  Today, 09/19/2014:  Heparin level therapeutic after heparin IV rate reduced this AM  No bleeding or complications reported by RN or documented in chart.  CBC: Hgb 13.7 (decreased, but remains WNL), Plt 182  Renal: SCr 0.94 is improved with  CrCl ~ 97 ml/min CG  Goal of Therapy:  Heparin level 0.3-0.7 units/ml Monitor platelets by anticoagulation protocol: Yes   Plan:  1) Continue IV heparin at current rate of 1550 units/hr 2) Will repeat heparin level in 6 hours to confirm goal level at current rate   Hessie Knows, PharmD, BCPS Pager [phone] 09/19/2014 4:45 PM

## 2014-09-20 LAB — CBC
HCT: 43.7 % (ref 39.0–52.0)
Hemoglobin: 14.3 g/dL (ref 13.0–17.0)
MCH: 28.8 pg (ref 26.0–34.0)
MCHC: 32.7 g/dL (ref 30.0–36.0)
MCV: 87.9 fL (ref 78.0–100.0)
Platelets: 184 10*3/uL (ref 150–400)
RBC: 4.97 MIL/uL (ref 4.22–5.81)
RDW: 14.2 % (ref 11.5–15.5)
WBC: 11 10*3/uL — ABNORMAL HIGH (ref 4.0–10.5)

## 2014-09-20 LAB — HEPARIN LEVEL (UNFRACTIONATED): HEPARIN UNFRACTIONATED: 0.49 [IU]/mL (ref 0.30–0.70)

## 2014-09-20 LAB — PROTEIN C, TOTAL: PROTEIN C, TOTAL: 84 % (ref 70–140)

## 2014-09-20 MED ORDER — TAMSULOSIN HCL 0.4 MG PO CAPS: 0.4000 mg | ORAL_CAPSULE | Freq: Every day | ORAL | Status: DC

## 2014-09-20 MED ORDER — METOPROLOL SUCCINATE ER 50 MG PO TB24
75.0000 mg | ORAL_TABLET | Freq: Every day | ORAL | Status: DC
  Administered 2014-09-21: 75 mg via ORAL
  Filled 2014-09-20 (×2): qty 1

## 2014-09-20 MED ORDER — APIXABAN 5 MG PO TABS
10.0000 mg | ORAL_TABLET | Freq: Two times a day (BID) | ORAL | Status: DC
  Administered 2014-09-20 – 2014-09-21 (×3): 10 mg via ORAL
  Filled 2014-09-20 (×3): qty 2

## 2014-09-20 MED ORDER — APIXABAN 5 MG PO TABS: 5.0000 mg | ORAL_TABLET | Freq: Two times a day (BID) | ORAL | Status: DC

## 2014-09-20 MED ORDER — SODIUM CHLORIDE 0.9 % IV SOLN
INTRAVENOUS | Status: AC
  Administered 2014-09-20 (×2): via INTRAVENOUS

## 2014-09-20 MED ORDER — TAMSULOSIN HCL 0.4 MG PO CAPS
0.4000 mg | ORAL_CAPSULE | Freq: Every day | ORAL | Status: DC
  Administered 2014-09-20 – 2014-09-21 (×2): 0.4 mg via ORAL
  Filled 2014-09-20 (×2): qty 1

## 2014-09-20 NOTE — Progress Notes (Signed)
PT Cancellation Note  Patient Details Name: Jesus [name] MRN: [id number] DOB: Aug 26, 1945   Cancelled Treatment:    Reason Eval/Treat Not Completed: Pain limiting ability to participate, patient reports pain is just subsiding and  Wants to wait.   [name] 09/20/2014, 3:38 PM Blanchard Kelch PT [phone]

## 2014-09-20 NOTE — Progress Notes (Signed)
ANTICOAGULATION CONSULT NOTE - Follow up Consult  Pharmacy Consult for Heparin Indication: pulmonary embolus  No Known Allergies  Patient Measurements: Height: 5\' 11"  (180.3 cm) Weight: 241 lb 10 oz (109.6 kg) IBW/kg (Calculated) : 75.3 Heparin Dosing Weight: 99 kg  Vital Signs: Temp: 98.7 F (37.1 C) (06/25 0318) Temp Source: Oral (06/25 0318) BP: 130/86 mmHg (06/25 0600)  Labs:  Recent Labs  09/17/14 0800 09/17/14 1330 09/17/14 2114 09/18/14 0238  09/19/14 0335 09/19/14 0825 09/19/14 1615 09/19/14 2156  HGB 16.0  --   --   --   --  13.7  --   --   --   HCT 47.2  --   --   --   --  41.6  --   --   --   PLT 159  --   --   --   --  182  --   --   --   APTT  --  31  --   --   --   --   --   --   --   LABPROT  --  14.6  --   --   --   --   --   --   --   INR  --  1.12  --   --   --   --   --   --   --   HEPARINUNFRC  --   --  0.63  --   < >  --  0.80* 0.49 0.48  CREATININE 1.18  --   --   --   --  0.94  --   --   --   TROPONINI  --  0.20* 0.14* 0.12*  --   --   --   --   --   < > = values in this interval not displayed.  Estimated Creatinine Clearance: 97.3 mL/min (by C-G formula based on Cr of 0.94).   Medications  Infusions:  . heparin 1,550 Units/hr (09/19/14 2321)    Assessment: 44 yoM admitted 6/18 for an incarcerated umbilical hernia with partial small bowel obstruction. NG tube was placed on 6/20 for decompression and being managed conservatively. Noted with hypoxia overnight 6/21, uncontrolled HTN and tachycardia 6/22 AM. CT reveals double PE in RL. Patient has been on VTE ppx with SQ heparin since admission; appears unprovoked. Pharmacy is consulted to dose Heparin IV.  Today, 09/20/2014:  Heparin level:  0.49, remains therapeutic  No bleeding or complications reported by RN or documented in chart.  CBC: Hgb 14.3, Plt 184  Renal: SCr 0.94 (6/24)  Goal of Therapy:  Heparin level 0.3-0.7 units/ml Monitor platelets by anticoagulation protocol: Yes   Plan:   Continue heparin IV infusion at 1550 units/hr   Daily heparin level and CBC  Continue to monitor H&H and platelets  Follow up plans for transition to Eliquis  Recommended Eliquis dosing: 10 mg twice daily for 7 days followed by 5 mg twice daily.  Recommend to discontinue Eliquis at least 48 hours prior to elective surgery or invasive procedures with a moderate to high risk of clinically significant bleeding.  Lynann Beaver PharmD, BCPS Pager [phone] 09/20/2014 7:06 AM

## 2014-09-20 NOTE — Discharge Instructions (Signed)
Information on my medicine - ELIQUIS® (apixaban) ° °This medication education was reviewed with me or my healthcare representative as part of my discharge preparation.  The pharmacist that spoke with me during my hospital stay was: Dale Ribeiro  ° °Why was Eliquis® prescribed for you? °Eliquis® was prescribed to treat blood clots that may have been found in the veins of your legs (deep vein thrombosis) or in your lungs (pulmonary embolism) and to reduce the risk of them occurring again. ° °What do You need to know about Eliquis® ? °The starting dose is 10 mg (two 5 mg tablets) taken TWICE daily for the FIRST SEVEN (7) DAYS, then on (enter date)  09/27/14  the dose is reduced to ONE 5 mg tablet taken TWICE daily.  Eliquis® may be taken with or without food.  ° °Try to take the dose about the same time in the morning and in the evening. If you have difficulty swallowing the tablet whole please discuss with your pharmacist how to take the medication safely. ° °Take Eliquis® exactly as prescribed and DO NOT stop taking Eliquis® without talking to the doctor who prescribed the medication.  Stopping may increase your risk of developing a new blood clot.  Refill your prescription before you run out. ° °After discharge, you should have regular check-up appointments with your healthcare provider that is prescribing your Eliquis®. °   °What do you do if you miss a dose? °If a dose of ELIQUIS® is not taken at the scheduled time, take it as soon as possible on the same day and twice-daily administration should be resumed. The dose should not be doubled to make up for a missed dose. ° °Important Safety Information °A possible side effect of Eliquis® is bleeding. You should call your healthcare provider right away if you experience any of the following: °? Bleeding from an injury or your nose that does not stop. °? Unusual colored urine (red or dark brown) or unusual colored stools (red or black). °? Unusual bruising for unknown  reasons. °? A serious fall or if you hit your head (even if there is no bleeding). ° °Some medicines may interact with Eliquis® and might increase your risk of bleeding or clotting while on Eliquis®. To help avoid this, consult your healthcare provider or pharmacist prior to using any new prescription or non-prescription medications, including herbals, vitamins, non-steroidal anti-inflammatory drugs (NSAIDs) and supplements. ° °This website has more information on Eliquis® (apixaban): http://www.eliquis.com/eliquis/home °  °

## 2014-09-20 NOTE — Progress Notes (Signed)
Patient Demographics  Jesus [name], is a 69 y.o. male, DOB - 02/01/1946, WUJ:[id number]  Admit date - 09/13/2014   Admitting Physician Ovidio Kin, MD  Outpatient Primary MD for the patient is Karie Chimera, MD  LOS - 5                                     Consult Follow up   Chief Complaint  Patient presents with  . Vomiting       Admission HPI/Brief narrative: 69 year old male admitted with partial small bowel obstruction secondary to incarcerated umbilical hernia, develop tachycardia, hypoxia, CT chest angiogram significant for PE on 6/22, transferred to stepdown, started on heparin drip, partial small obstruction appears to be resolving clinically, advanced to soft diet.  Subjective:   Jesus [name] today has, No headache, No chest pain, No abdominal pain - No Nausea, No new weakness tingling or numbness, No Cough - SOB.  reports he has been passing gas,  tolerating clear liquid.  Assessment & Plan    Principal Problem:   Small bowel obstruction Active Problems:   Acute pulmonary embolism   Malignant hypertension   Tachycardia   Elevated troponin   small bowel obstruction secondary to ventral hernia  - Appears to be clinically improving , patient advanced to soft diet, NG tube discontinued. - DC Foley, PT consulted. - Transferred to telemetry floor.  Pulmonary embolism  - Given patient partial small bowel obstruction and incarcerated hernia resolved medically, and no indication for emergent surgery, changes heparin drip to Eliquis, patient didn't need to be anticoagulated for 6 weeks at least prior to elective surgery, where he can be transitioned to Lovenox 1 week prior to surgery. - Follow on hypercoagulable workup (so far workup is negative, but factor V Leiden still pending) - Venous Doppler negative for DVT   Hypertension - Better controlled after metoprolol XL was increased to  25 to 50 (patient was on Toprol-XL 25 twice a day), but did require couple IV pushes for blood pressure control, increase Toprol-XL to 75 mg oral daily.  Sinus tachycardia - In the setting of acute PE, possible volume depletion, as well as possible beta blocker withdrawal(as patient was on 50 of Toprol-XL as an outpatient),  increased metoprolol dose, continue with IV fluids.   elevated troponin  - Cardiac consult greatly appreciated , most likely demand ischemia , no indication for further workup .    Code Status: Full   Family Communication: none at bedside     Procedures  none      Medications  Scheduled Meds: . labetalol  20 mg Intravenous Once  . LORazepam  1 mg Intravenous QHS  . [START ON 09/21/2014] metoprolol succinate  75 mg Oral Daily  . tamsulosin  0.4 mg Oral QPC breakfast   Continuous Infusions: . sodium chloride 75 mL/hr at 09/20/14 0947  . heparin 1,550 Units/hr (09/19/14 2321)   PRN Meds:.acetaminophen, hydrALAZINE, HYDROcodone-acetaminophen, menthol-cetylpyridinium, metoprolol, morphine injection, ondansetron, phenol, sodium chloride  DVT Prophylaxis  On heaprin gtt  Lab Results  Component Value Date   PLT 184 09/20/2014    Antibiotics    Anti-infectives    Start  Dose/Rate Route Frequency Ordered Stop   09/17/14 0600  cefOXitin (MEFOXIN) 2 g in dextrose 5 % 50 mL IVPB  Status:  Discontinued     2 g 100 mL/hr over 30 Minutes Intravenous On call to O.R. 09/16/14 1206 09/16/14 1323   09/17/14 0600  ceFAZolin (ANCEF) IVPB 2 g/50 mL premix  Status:  Discontinued     2 g 100 mL/hr over 30 Minutes Intravenous  Once 09/16/14 1319 09/17/14 1510          Objective:   Filed Vitals:   09/20/14 0754 09/20/14 0800 09/20/14 0900 09/20/14 0942  BP:  146/100 160/104 173/111  Pulse:    111  Temp: 98.3 F (36.8 C)     TempSrc: Oral     Resp:  28 27   Height:      Weight:      SpO2:  93% 94%     Wt Readings from Last 3 Encounters:  09/17/14  109.6 kg (241 lb 10 oz)  11/16/11 108.863 kg (240 lb)  06/13/11 108.863 kg (240 lb)     Intake/Output Summary (Last 24 hours) at 09/20/14 1000 Last data filed at 09/20/14 0947  Gross per 24 hour  Intake 1071.25 ml  Output   1345 ml  Net -273.75 ml     Physical Exam  Awake Alert, Oriented X 3, No new F.N deficits, Normal affect [place].AT,PERRAL Supple Neck,No JVD, No cervical lymphadenopathy appriciated.  Symmetrical Chest wall movement, Good air movement bilaterally, CTAB Tachycardic,No Gallops,Rubs or new Murmurs, No Parasternal Heave +ve B.Sounds, Abd Soft, No tenderness, No organomegaly appriciated, No rebound - guarding or rigidity. hernia is reduced. No Cyanosis, Clubbing or edema, No new Rash or bruise     Data Review   Micro Results Recent Results (from the past 240 hour(s))  MRSA PCR Screening     Status: None   Collection Time: 09/15/14  9:58 AM  Result Value Ref Range Status   MRSA by PCR NEGATIVE NEGATIVE Final    Comment:        The GeneXpert MRSA Assay (FDA approved for NASAL specimens only), is one component of a comprehensive MRSA colonization surveillance program. It is not intended to diagnose MRSA infection nor to guide or monitor treatment for MRSA infections.     Radiology Reports Ct Angio Chest Pe W/cm &/or Wo Cm  09/17/2014   CLINICAL DATA:  Shortness of breath, tachycardia.  EXAM: CT ANGIOGRAPHY CHEST WITH CONTRAST  TECHNIQUE: Multidetector CT imaging of the chest was performed using the standard protocol during bolus administration of intravenous contrast. Multiplanar CT image reconstructions and MIPs were obtained to evaluate the vascular anatomy.  CONTRAST:  OMNIPAQUE IOHEXOL 350 MG/ML SOLN  COMPARISON:  None.  FINDINGS: Filling defects noted in the right lower lobe pulmonary artery compatible with acute pulmonary embolus. Pulmonary embolus also likely extends into the right upper lobe pulmonary artery. No pulmonary emboli on the left.   Heart is normal size. Aorta is normal caliber. No mediastinal, hilar, or axillary adenopathy. Chest wall soft tissues are unremarkable.  Linear areas of atelectasis in the left lower lobe and right middle lobe. No pleural effusions.  NG tube is in place with the tip in the stomach.  Review of the MIP images confirms the above findings.  IMPRESSION: Small to medium sized pulmonary emboli noted in the right upper lobe and right lower lobe pulmonary arteries.  Areas of atelectasis in the left lower lobe and right middle lobe.  NG tube  in the stomach.  These results will be called to the ordering clinician or representative by the Radiologist Assistant, and communication documented in the PACS or zVision Dashboard.   Electronically Signed   By: Charlett Nose M.D.   On: 09/17/2014 12:25   Ct Abdomen Pelvis W Contrast  09/13/2014   CLINICAL DATA:  Vomiting, abdominal pain  EXAM: CT ABDOMEN AND PELVIS WITH CONTRAST  TECHNIQUE: Multidetector CT imaging of the abdomen and pelvis was performed using the standard protocol following bolus administration of intravenous contrast.  CONTRAST:  OMNIPAQUE IOHEXOL 300 MG/ML  SOLN  COMPARISON:  None.  FINDINGS: Lower chest: Curvilinear bilateral lower lobe scarring or atelectasis. Streak artifact from the patient's left arm obscures detail.  Hepatobiliary: Liver is unremarkable allowing for motion artifact and streak artifact. Gallbladder are unremarkable.  Pancreas: Normal  Spleen: Normal  Adrenals/Urinary Tract: Numerous bilateral renal cortical cysts are identified, largest right upper renal pole 3.4 cm image 15. No hydroureteronephrosis. Adrenal glands are unremarkable. No radiopaque renal or ureteral calculus. Bladder is unremarkable.  Stomach/Bowel: There is distal small bowel obstruction with the point of transition at the level of the terminal ileum as it traverses a complex fat and bowel containing ventral abdominal wall hernia. Example, image 50 series 2. Small bowel  feces sign is evident. There is fat stranding surrounding the loops of bowel within the hernia. The colon is decompressed. Normal appendix, image 58.  Vascular/Lymphatic: No aortic aneurysm.  No lymphadenopathy.  Other: No free air or fluid.  Musculoskeletal: Lower lumbar spine disc degenerative change. No acute osseous abnormality.  IMPRESSION: Distal small bowel obstruction the transition point at the level of the terminal ileum secondary to incarceration within anterior abdominal wall hernia. Stranding is noted surrounding loops of bowel within the hernia and this does confer a risk of ischemia.   Electronically Signed   By: Christiana Pellant M.D.   On: 09/13/2014 14:52   Dg Abd 2 Views  09/16/2014   CLINICAL DATA:  Partial small bowel obstruction.  Distended abdomen.  EXAM: ABDOMEN - 2 VIEW  COMPARISON:  09/14/2014  FINDINGS: Interval placement of enteric tube with tip just below the expected region of the gastroesophageal junction as this could be advanced 8-10 cm.  Continued evidence of multiple air-filled dilated small bowel loops with differential air-fluid levels. Small bowel measures up to 5.7 cm in diameter. Appearance slightly worse overall compared to the previous exam. Minimal air throughout the colon. No definite free peritoneal air. Remainder of the exam is unchanged.  IMPRESSION: Continued evidence of small bowel obstruction with slight overall interval worsening.  Enteric tube with tip just below the expected region of the gastroesophageal junction. This could be advanced another 8-10 cm.   Electronically Signed   By: Elberta Fortis M.D.   On: 09/16/2014 09:48   Dg Abd 2 Views  09/14/2014   CLINICAL DATA:  Abdominal distention, small bowel obstruction. Recurrent small bowel obstructions secondary to hernia repair  EXAM: ABDOMEN - 2 VIEW  COMPARISON:  09/13/2014 CT  FINDINGS: There multiple dilated loopsdilated loops of small bowel measuring up to 5 cm. The number of loops of dilated small bowel  appears increased from CT topogram 1 day prior. On the upright exam there are multiple short air-fluid levels within the small bowel. There is minimal gas within the colon. Small amount a gas in the rectum.  IMPRESSION: 1. No improvement in small bowel obstruction pattern. 2. Small  gas within the rectum. 3. No free  air.   Electronically Signed   By: Genevive Bi M.D.   On: 09/14/2014 10:16   Dg Abd Portable 1v  09/18/2014   CLINICAL DATA:  Small-bowel obstruction.  EXAM: PORTABLE ABDOMEN - 1 VIEW  COMPARISON:  09/16/2014.  FINDINGS: NG tube in stable position in the upper most portion of the stomach. Interim partial resolution of small bowel distention. Stool noted in colon. No free air. Pelvic calcifications consistent phleboliths. Degenerative change lumbar spine and both hips  IMPRESSION: Interim partial resolution of small bowel distention. Stool noted throughout the colon. No free air.   Electronically Signed   By: Maisie Fus  Register   On: 09/18/2014 09:52     CBC  Recent Labs Lab 09/13/14 1222 09/14/14 0528 09/17/14 0800 09/19/14 0335 09/20/14 0740  WBC 13.5* 4.2 9.4 12.3* 11.0*  HGB 16.5 14.8 16.0 13.7 14.3  HCT 48.9 44.9 47.2 41.6 43.7  PLT 194 191 159 182 184  MCV 85.8 87.4 85.8 87.0 87.9  MCH 28.9 28.8 29.1 28.7 28.8  MCHC 33.7 33.0 33.9 32.9 32.7  RDW 13.8 14.1 13.5 13.9 14.2    Chemistries   Recent Labs Lab 09/13/14 1222 09/14/14 0528 09/17/14 0800 09/19/14 0335  NA 141 143 133* 143  K 3.9 4.0 3.5 3.3*  CL 102 105 101 104  CO2 28 29 26 27   GLUCOSE 145* 118* 111* 88  BUN 21* 20 14 21*  CREATININE 1.13 1.04 1.18 0.94  CALCIUM 10.1 8.7* 8.8* 9.0  MG  --   --  2.0  --   AST 28  --   --   --   ALT 25  --   --   --   ALKPHOS 70  --   --   --   BILITOT 1.1  --   --   --    ------------------------------------------------------------------------------------------------------------------ estimated creatinine clearance is 97.3 mL/min (by C-G formula based on Cr of  0.94). ------------------------------------------------------------------------------------------------------------------ No results for input(s): HGBA1C in the last 72 hours. ------------------------------------------------------------------------------------------------------------------ No results for input(s): CHOL, HDL, LDLCALC, TRIG, CHOLHDL, LDLDIRECT in the last 72 hours. ------------------------------------------------------------------------------------------------------------------ No results for input(s): TSH, T4TOTAL, T3FREE, THYROIDAB in the last 72 hours.  Invalid input(s): FREET3 ------------------------------------------------------------------------------------------------------------------ No results for input(s): VITAMINB12, FOLATE, FERRITIN, TIBC, IRON, RETICCTPCT in the last 72 hours.  Coagulation profile  Recent Labs Lab 09/17/14 1330  INR 1.12    No results for input(s): DDIMER in the last 72 hours.  Cardiac Enzymes  Recent Labs Lab 09/17/14 1330 09/17/14 2114 09/18/14 0238  TROPONINI 0.20* 0.14* 0.12*   ------------------------------------------------------------------------------------------------------------------ Invalid input(s): POCBNP     Time Spent in minutes   30 minutes   Clarisse Rodriges M.D on 09/20/2014 at 10:00 AM  Between 7am to 7pm - Pager - [phone]  After 7pm go to [email or web address] - password [facility]  [facility]   Office  [phone]

## 2014-09-20 NOTE — Progress Notes (Signed)
ANTICOAGULATION CONSULT NOTE - Follow Up Consult  Pharmacy Consult for Heparin Indication: pulmonary embolus  No Known Allergies  Patient Measurements: Height: 5\' 11"  (180.3 cm) Weight: 241 lb 10 oz (109.6 kg) IBW/kg (Calculated) : 75.3 Heparin Dosing Weight:   Vital Signs: Temp: 98.7 F (37.1 C) (06/25 0318) Temp Source: Oral (06/25 0318) BP: 132/89 mmHg (06/25 0300)  Labs:  Recent Labs  09/17/14 0800 09/17/14 1330 09/17/14 2114 09/18/14 0238  09/19/14 0335 09/19/14 0825 09/19/14 1615 09/19/14 2156  HGB 16.0  --   --   --   --  13.7  --   --   --   HCT 47.2  --   --   --   --  41.6  --   --   --   PLT 159  --   --   --   --  182  --   --   --   APTT  --  31  --   --   --   --   --   --   --   LABPROT  --  14.6  --   --   --   --   --   --   --   INR  --  1.12  --   --   --   --   --   --   --   HEPARINUNFRC  --   --  0.63  --   < >  --  0.80* 0.49 0.48  CREATININE 1.18  --   --   --   --  0.94  --   --   --   TROPONINI  --  0.20* 0.14* 0.12*  --   --   --   --   --   < > = values in this interval not displayed.  Estimated Creatinine Clearance: 97.3 mL/min (by C-G formula based on Cr of 0.94).   Medications:  Infusions:  . heparin 1,550 Units/hr (09/19/14 2321)    Assessment: Patient with PE.  2nd heparin level at goal.  No heparin issues noted.  Goal of Therapy:  Heparin level 0.3-0.7 units/ml Monitor platelets by anticoagulation protocol: Yes   Plan:  Continue heparin drip at current rate Recheck level with AM labs  Aleene Davidson Crowford 09/20/2014,4:13 AM

## 2014-09-20 NOTE — Progress Notes (Signed)
   SUBJECTIVE: The patient is doing well today.  At this time, he denies chest pain, shortness of breath, or any new concerns.  [name] labetalol  20 mg Intravenous Once  . LORazepam  1 mg Intravenous QHS  . metoprolol succinate  50 mg Oral Daily   . sodium chloride 75 mL/hr at 09/20/14 0729  . heparin 1,550 Units/hr (09/19/14 2321)    OBJECTIVE: Physical Exam: Filed Vitals:   09/20/14 0600 09/20/14 0700 09/20/14 0754 09/20/14 0800  BP: 130/86 114/81  146/100  Pulse:      Temp:   98.3 F (36.8 C)   TempSrc:   Oral   Resp: 19 16  28   Height:      Weight:      SpO2: 94% 95%  93%    Intake/Output Summary (Last 24 hours) at 09/20/14 0849 Last data filed at 09/20/14 0800  Gross per 24 hour  Intake 980.75 ml  Output   1185 ml  Net -204.25 ml    Telemetry reveals sinus rhythm  GEN- The patient is well appearing, alert and oriented x 3 today.   Head- normocephalic, atraumatic Eyes-  Sclera clear, conjunctiva pink Ears- hearing intact Oropharynx- clear Neck- supple, no JVP Lymph- no cervical lymphadenopathy Lungs- Clear to ausculation bilaterally, normal work of breathing Heart- Regular rate and rhythm, no murmurs, rubs or gallops, PMI not laterally displaced GI- soft, NT, ND, + BS Extremities- no clubbing, cyanosis, or edema  LABS: Basic Metabolic Panel:  Recent Labs  22/63/33 0335  NA 143  K 3.3*  CL 104  CO2 27  GLUCOSE 88  BUN 21*  CREATININE 0.94  CALCIUM 9.0   Liver Function Tests: No results for input(s): AST, ALT, ALKPHOS, BILITOT, PROT, ALBUMIN in the last 72 hours. No results for input(s): LIPASE, AMYLASE in the last 72 hours. CBC:  Recent Labs  09/19/14 0335 09/20/14 0740  WBC 12.3* 11.0*  HGB 13.7 14.3  HCT 41.6 43.7  MCV 87.0 87.9  PLT 182 184   Cardiac Enzymes:  Recent Labs  09/17/14 1330 09/17/14 2114 09/18/14 0238  TROPONINI 0.20* 0.14* 0.12*    ASSESSMENT AND PLAN:  Pulmonary embolism  Anticoagulation per primary  team  Hypertension improving  Sinus tachycardia Secondary to PTE  elevated troponin  Secondary to PTE, no indication for further workup .  Cardiology to see as needed going forward Please call with questions  Hillis Range, MD 09/20/2014 8:49 AM

## 2014-09-20 NOTE — Progress Notes (Signed)
7 Days Post-Op  Subjective: Feels better today.  No dyspnea.  No abdominal pain.  Tolerating full liquids.  Passing gas.  Objective: Vital signs in last 24 hours: Temp:  [98.3 F (36.8 C)-99 F (37.2 C)] 98.3 F (36.8 C) (06/25 0754) Resp:  [14-28] 28 (06/25 0800) BP: (94-177)/(65-109) 146/100 mmHg (06/25 0800) SpO2:  [93 %-97 %] 93 % (06/25 0800) Last BM Date: 09/17/14  Intake/Output from previous day: 06/24 0701 - 06/25 0700 In: 926.5 [I.V.:926.5] Out: 1075 [Urine:1075] Intake/Output this shift: Total I/O In: 54.3 [I.V.:54.3] Out: 110 [Urine:110]  PE: General- In NAD Abdomen-soft, hernia easily reducible, no tenderness  Lab Results:   Recent Labs  09/19/14 0335 09/20/14 0740  WBC 12.3* 11.0*  HGB 13.7 14.3  HCT 41.6 43.7  PLT 182 184   BMET  Recent Labs  09/19/14 0335  NA 143  K 3.3*  CL 104  CO2 27  GLUCOSE 88  BUN 21*  CREATININE 0.94  CALCIUM 9.0   PT/INR  Recent Labs  09/17/14 1330  LABPROT 14.6  INR 1.12   Comprehensive Metabolic Panel:    Component Value Date/Time   NA 143 09/19/2014 0335   NA 133* 09/17/2014 0800   K 3.3* 09/19/2014 0335   K 3.5 09/17/2014 0800   CL 104 09/19/2014 0335   CL 101 09/17/2014 0800   CO2 27 09/19/2014 0335   CO2 26 09/17/2014 0800   BUN 21* 09/19/2014 0335   BUN 14 09/17/2014 0800   CREATININE 0.94 09/19/2014 0335   CREATININE 1.18 09/17/2014 0800   GLUCOSE 88 09/19/2014 0335   GLUCOSE 111* 09/17/2014 0800   CALCIUM 9.0 09/19/2014 0335   CALCIUM 8.8* 09/17/2014 0800   AST 28 09/13/2014 1222   AST 25 05/07/2012 1550   ALT 25 09/13/2014 1222   ALT 22 05/07/2012 1550   ALKPHOS 70 09/13/2014 1222   ALKPHOS 66 05/07/2012 1550   BILITOT 1.1 09/13/2014 1222   BILITOT 0.4 05/07/2012 1550   PROT 7.8 09/13/2014 1222   PROT 7.6 05/07/2012 1550   ALBUMIN 4.6 09/13/2014 1222   ALBUMIN 4.2 05/07/2012 1550     Studies/Results: Dg Abd Portable 1v  09/18/2014   CLINICAL DATA:  Small-bowel  obstruction.  EXAM: PORTABLE ABDOMEN - 1 VIEW  COMPARISON:  09/16/2014.  FINDINGS: NG tube in stable position in the upper most portion of the stomach. Interim partial resolution of small bowel distention. Stool noted in colon. No free air. Pelvic calcifications consistent phleboliths. Degenerative change lumbar spine and both hips  IMPRESSION: Interim partial resolution of small bowel distention. Stool noted throughout the colon. No free air.   Electronically Signed   By: Maisie Fus  Register   On: 09/18/2014 09:52    Anti-infectives: Anti-infectives    Start     Dose/Rate Route Frequency Ordered Stop   09/17/14 0600  cefOXitin (MEFOXIN) 2 g in dextrose 5 % 50 mL IVPB  Status:  Discontinued     2 g 100 mL/hr over 30 Minutes Intravenous On call to O.R. 09/16/14 1206 09/16/14 1323   09/17/14 0600  ceFAZolin (ANCEF) IVPB 2 g/50 mL premix  Status:  Discontinued     2 g 100 mL/hr over 30 Minutes Intravenous  Once 09/16/14 1319 09/17/14 1510      Assessment  SBO due to recurrent ventral hernia-resolved, hernia easily reducible. No surgery at this time. Ideally would like to wait 4-6 weeks given new PE diagnosis, then schedule elective repair Right PEs-doing better from this standpoint. Eventually,  plan to transition to Eliquis. Would need a lovenox bridge if surgery is needed or IVC filter for urgent sugery. CV-less tachycardic.Still with HTN. FEN-tolerating full liquids Dispo-ICU status    LOS: 5 days   Plan: Advance to soft diet.  Transfer to floor if okay with medical service.  Anticoagulation management and HTN management per medical service.  Restart Flomax.  D/C foley.   Siegfried Vieth Shela Commons 09/20/2014

## 2014-09-20 NOTE — Progress Notes (Addendum)
ANTICOAGULATION CONSULT NOTE - Follow up Consult  Pharmacy Consult for Apixaban (Eliquis) Indication: pulmonary embolus  No Known Allergies  Patient Measurements: Height: 5\' 11"  (180.3 cm) Weight: 241 lb 10 oz (109.6 kg) IBW/kg (Calculated) : 75.3 Heparin Dosing Weight: 99 kg  Vital Signs: Temp: 98.3 F (36.8 C) (06/25 0754) Temp Source: Oral (06/25 0754) BP: 173/111 mmHg (06/25 0942) Pulse Rate: 111 (06/25 0942)  Labs:  Recent Labs  09/17/14 1330 09/17/14 2114 09/18/14 0238  09/19/14 0335  09/19/14 1615 09/19/14 2156 09/20/14 0740  HGB  --   --   --   --  13.7  --   --   --  14.3  HCT  --   --   --   --  41.6  --   --   --  43.7  PLT  --   --   --   --  182  --   --   --  184  APTT 31  --   --   --   --   --   --   --   --   LABPROT 14.6  --   --   --   --   --   --   --   --   INR 1.12  --   --   --   --   --   --   --   --   HEPARINUNFRC  --  0.63  --   < >  --   < > 0.49 0.48 0.49  CREATININE  --   --   --   --  0.94  --   --   --   --   TROPONINI 0.20* 0.14* 0.12*  --   --   --   --   --   --   < > = values in this interval not displayed.  Estimated Creatinine Clearance: 97.3 mL/min (by C-G formula based on Cr of 0.94).   Medications  Infusions:  . sodium chloride 75 mL/hr at 09/20/14 0947  . heparin 1,550 Units/hr (09/19/14 2321)    Assessment: 67 yoM admitted 6/18 for an incarcerated umbilical hernia with partial small bowel obstruction. NG tube was placed on 6/20 for decompression and being managed conservatively. Noted with hypoxia overnight 6/21, uncontrolled HTN and tachycardia 6/22 AM. CT reveals double PE in RL. Patient has been on VTE ppx with SQ heparin since admission; appears unprovoked. Pharmacy was initially consulted to dose Heparin IV, but is now consulted for apixaban (Eliquis) dosing.  Today, 09/20/2014:  Heparin level:  0.49, remains therapeutic  No bleeding or complications reported by RN or documented in chart.  CBC: Hgb 14.3, Plt  184  Renal: SCr 0.94 (6/24) with CrCl ~ 97 ml/min.  Goal of Therapy:  Monitor platelets by anticoagulation protocol: Yes   Plan:   Discontinue heparin IV infusion at 10:30 AM 6/25  Start Eliquis 10mg  PO BID x7 days, then 5 mg PO BID.  First dose at 10:30 AM on 6/25.  Continue to monitor renal function and CBC  Recommend to discontinue Eliquis at least 48 hours prior to elective surgery or invasive procedures with a moderate to high risk of clinically significant bleeding.  Pharmacist to provide patient education prior to discharge.  Lynann Beaver PharmD, BCPS Pager [phone] 09/20/2014 10:00 AM   Addendum: Patient apixaban education completed 09/20/14.

## 2014-09-21 LAB — BASIC METABOLIC PANEL
Anion gap: 7 (ref 5–15)
BUN: 16 mg/dL (ref 6–20)
CO2: 26 mmol/L (ref 22–32)
Calcium: 8.5 mg/dL — ABNORMAL LOW (ref 8.9–10.3)
Chloride: 104 mmol/L (ref 101–111)
Creatinine, Ser: 0.94 mg/dL (ref 0.61–1.24)
GFR calc Af Amer: >60 mL/min (ref >60)
GFR calc non Af Amer: >60 mL/min (ref >60)
GLUCOSE: 95 mg/dL (ref 65–99)
Potassium: 3.3 mmol/L — ABNORMAL LOW (ref 3.5–5.1)
Sodium: 137 mmol/L (ref 135–145)

## 2014-09-21 LAB — CBC
HEMATOCRIT: 38.7 % — AB (ref 39.0–52.0)
Hemoglobin: 12.8 g/dL — ABNORMAL LOW (ref 13.0–17.0)
MCH: 28.9 pg (ref 26.0–34.0)
MCHC: 33.1 g/dL (ref 30.0–36.0)
MCV: 87.4 fL (ref 78.0–100.0)
PLATELETS: 165 10*3/uL (ref 150–400)
RBC: 4.43 MIL/uL (ref 4.22–5.81)
RDW: 13.9 % (ref 11.5–15.5)
WBC: 8.6 10*3/uL (ref 4.0–10.5)

## 2014-09-21 MED ORDER — HYDROCODONE-ACETAMINOPHEN 5-325 MG PO TABS: 1.0000 | ORAL_TABLET | ORAL | Status: DC | PRN

## 2014-09-21 MED ORDER — SODIUM CHLORIDE 0.9 % IV BOLUS (SEPSIS): 500.0000 mL | Freq: Once | INTRAVENOUS | Status: DC

## 2014-09-21 MED ORDER — METOPROLOL SUCCINATE ER 25 MG PO TB24: 25.0000 mg | ORAL_TABLET | Freq: Every evening | ORAL | Status: DC

## 2014-09-21 MED ORDER — POTASSIUM CHLORIDE CRYS ER 20 MEQ PO TBCR
30.0000 meq | EXTENDED_RELEASE_TABLET | ORAL | Status: AC
  Administered 2014-09-21 (×2): 30 meq via ORAL
  Filled 2014-09-21 (×4): qty 1

## 2014-09-21 MED ORDER — APIXABAN 5 MG PO TABS: ORAL_TABLET | ORAL | Status: DC

## 2014-09-21 MED ORDER — METOPROLOL SUCCINATE ER 50 MG PO TB24: 50.0000 mg | ORAL_TABLET | Freq: Every day | ORAL | Status: DC

## 2014-09-21 MED ORDER — METOPROLOL SUCCINATE ER 25 MG PO TB24: 75.0000 mg | ORAL_TABLET | Freq: Every day | ORAL | Status: DC

## 2014-09-21 MED ORDER — POTASSIUM CHLORIDE ER 10 MEQ PO TBCR: 20.0000 meq | EXTENDED_RELEASE_TABLET | Freq: Every day | ORAL | Status: DC

## 2014-09-21 NOTE — Discharge Summary (Signed)
Jesus Jesus [name], is a 69 y.o. Jesus [name]  DOB 1946-02-17  MRN [id number].  Admission date:  09/13/2014  Admitting Physician  Ovidio Kin, MD  Discharge Date:  09/21/2014   Primary MD  Karie Chimera, MD   Patient was admitted by surgical service, hospitalist are  consulting service.  Recommendations for primary care physician for things to follow:  - Please check CBC, BMP during next visit.   Admission Diagnosis  vomiting incarcerated abdominal wall hernia incisional hernia   Discharge Diagnosis  vomiting incarcerated abdominal wall hernia incisional hernia    Principal Problem:   Small bowel obstruction Active Problems:   Acute pulmonary embolism   Malignant hypertension   Tachycardia   Elevated troponin      Past Medical History  Diagnosis Date  . Hypertension   . Urinary hesitancy   . Hydrocele, right   . Spermatocele   . Anxiety     Past Surgical History  Procedure Laterality Date  . Umbilical hernia repair  1998  . Scrotal exploration  06/20/2011    Procedure: SCROTUM EXPLORATION;  Surgeon: Valetta Fuller, MD;  Location: [facility];  Service: Urology;  Laterality: Right;  . Hydrocele excision  06/20/2011    Procedure: HYDROCELECTOMY ADULT;  Surgeon: Valetta Fuller, MD;  Location: [facility];  Service: Urology;  Laterality: Right;  . Spermatocelectomy  06/20/2011    Procedure: SPERMATOCELECTOMY;  Surgeon: Valetta Fuller, MD;  Location: [facility];  Service: Urology;  Laterality: Right;       History of present illness and  Hospital Course:     Kindly see H&P for history of present illness and admission details, please review complete Labs, Consult reports and Test reports for all details in brief  HPI  from the history and physical done on the day of admission by surgery on 6/18.  Jesus Jesus [name]  whose primary care physician is Pcp Not In System (Dr. Clarisa Schools, [facility]) and comes to [facility] ER today for abdominal pain, nausea, and vomiting.  He came to the hospital to pick up his wife, who was admitted for a UTI. She was going home today. He started having abdominal pain Friday, 6/17. He has vomited about 6 times since the pain begain. His last BM was yesterday. He has never had a colonoscopy.  He had an abdominal wall hernia repaired in [place] about 1996. He has known the hernia recurred, but has not had anything done about it. He has no stomach, liver, or pancreas disease.  CT scan of abdomen - 09/13/2014 - Distal small bowel obstruction the transition point at the level of the terminal ileum secondary to incarceration within anterior abdominal wall hernia. Stranding is noted surrounding loops of bowel within the hernia and this does confer a risk of ischemia.  Hospital Course   69 year old Jesus [name] admitted with partial small bowel obstruction secondary to incarcerated umbilical hernia, develop tachycardia, hypoxia, CT chest angiogram significant for PE  on 6/22, transferred to stepdown, started on heparin drip, partial small obstruction appears to be resolving clinically, advanced to soft diet.  small bowel obstruction secondary to ventral hernia  - Resolved , tolerating soft diet - No indication for emergent surgery as incarcerated hernia was reduced. - Follow surgery as an outpatient for planned for elective surgery.  Pulmonary embolism  - Given patient partial small bowel obstruction and incarcerated hernia resolved medically, and no indication for emergent surgery, changed heparin drip to Eliquis, patient need to be anticoagulated for 6 weeks at least prior to elective surgery, where he can be transitioned to Lovenox 1 week prior to surgery. - Follow on hypercoagulable workup (so far workup is negative, but factor V Leiden still pending) - Venous Doppler negative for DVT     Hypertension - Better controlled on increased dose of Toprol-XL , will be discharged on Toprol-XL 15 mg a time, and 25 mg evening time.   Sinus tachycardia - In the setting of acute PE, possible volume depletion, significantly improved .  elevated troponin  - Cardiac consult greatly appreciated , most likely demand ischemia , no indication for further workup .   Discharge Condition: stable   Follow UP  Follow-up Information    Follow up with REESE,BETTI D, MD. Schedule an appointment as soon as possible for a visit in 1 week.   Specialty:  Family Medicine   Why:  Posthospitalization follow-up.   Contact information:   [address] [place] [address] [phone]       Follow up with ROSENBOWER,TODD J, MD. Call in 3 weeks.   Specialty:  General Surgery   Why:  For follow-up   Contact information:   [address] [place] [address] [phone]         Discharge Instructions  and  Discharge Medications     Discharge Instructions    Diet - low sodium heart healthy    Complete by:  As directed      Discharge instructions    Complete by:  As directed   Follow with Primary MD REESE,BETTI D, MD in 7 days   Get CBC, CMP, 2 view Chest X ray checked  by Primary MD next visit.    Activity: As tolerated with Full fall precautions use walker/cane & assistance as needed   Disposition Home    Diet: Heart Healthy ** , with feeding assistance and aspiration precautions.  For Heart failure patients - Check your Weight same time everyday, if you gain over 2 pounds, or you develop in leg swelling, experience more shortness of breath or chest pain, call your Primary MD immediately. Follow Cardiac Low Salt Diet and 1.5 lit/day fluid restriction.   On your next visit with your primary care physician please Get Medicines reviewed and adjusted.   Please request your Prim.MD to go over all Hospital Tests and Procedure/Radiological results  at the follow up, please get all Hospital records sent to your Prim MD by signing hospital release before you go home.   If you experience worsening of your admission symptoms, develop shortness of breath, life threatening emergency, suicidal or homicidal thoughts you must seek medical attention immediately by calling 911 or calling your MD immediately  if symptoms less severe.  You Must read complete instructions/literature along with all the possible adverse reactions/side effects for all the Medicines you take and that have been prescribed to you. Take any new Medicines after you have completely understood and  accpet all the possible adverse reactions/side effects.   Do not drive, operating heavy machinery, perform activities at heights, swimming or participation in water activities or provide baby sitting services if your were admitted for syncope or siezures until you have seen by Primary MD or a Neurologist and advised to do so again.  Do not drive when taking Pain medications.    Do not take more than prescribed Pain, Sleep and Anxiety Medications  Special Instructions: If you have smoked or chewed Tobacco  in the last 2 yrs please stop smoking, stop any regular Alcohol  and or any Recreational drug use.  Wear Seat belts while driving.   Please note  You were cared for by a hospitalist during your hospital stay. If you have any questions about your discharge medications or the care you received while you were in the hospital after you are discharged, you can call the unit and asked to speak with the hospitalist on call if the hospitalist that took care of you is not available. Once you are discharged, your primary care physician will handle any further medical issues. Please note that NO REFILLS for any discharge medications will be authorized once you are discharged, as it is imperative that you return to your primary care physician (or establish a relationship with a primary care  physician if you do not have one) for your aftercare needs so that they can reassess your need for medications and monitor your lab values.     Increase activity slowly    Complete by:  As directed             Medication List    STOP taking these medications        ibuprofen 200 MG tablet  Commonly known as:  ADVIL,MOTRIN     predniSONE 20 MG tablet  Commonly known as:  DELTASONE      TAKE these medications        apixaban 5 MG Tabs tablet  Commonly known as:  ELIQUIS  Please take  2 tablets (10 mg) oral two times  daily for 6 days (until 09/26/2014), then take 1 tablet (5 mg) oral two times daily thereafter.     cyclobenzaprine 10 MG tablet  Commonly known as:  FLEXERIL  Take 1 tablet (10 mg total) by mouth 2 (two) times daily as needed for muscle spasms.     dextromethorphan 30 MG/5ML liquid  Commonly known as:  DELSYM  Take 2.5 mLs (15 mg total) by mouth 2 (two) times daily.     guaiFENesin 100 MG/5ML liquid  Commonly known as:  ROBITUSSIN  Take 5-10 mLs (100-200 mg total) by mouth every 4 (four) hours as needed for cough.     HYDROcodone-acetaminophen 5-325 MG per tablet  Commonly known as:  NORCO/VICODIN  Take 1-2 tablets by mouth every 4 (four) hours as needed for moderate pain.     LORazepam 1 MG tablet  Commonly known as:  ATIVAN  Take 0.5 mg by mouth 2 (two) times daily.     metoprolol succinate 50 MG 24 hr tablet  Commonly known as:  TOPROL XL  Take 1 tablet (50 mg total) by mouth daily. Take with or immediately following a meal.     metoprolol succinate 25 MG 24 hr tablet  Commonly known as:  TOPROL XL  Take 1 tablet (25 mg total) by mouth every evening.     multivitamin with minerals Tabs tablet  Take 1 tablet by mouth daily.  oxymetazoline 0.05 % nasal spray  Commonly known as:  AFRIN  Place 1 spray into both nostrils 2 (two) times daily as needed for congestion.     potassium chloride 10 MEQ tablet  Commonly known as:  K-DUR  Take 2 tablets  (20 mEq total) by mouth daily.     tamsulosin 0.4 MG Caps capsule  Commonly known as:  FLOMAX  Take 0.4 mg by mouth daily.     traMADol 50 MG tablet  Commonly known as:  ULTRAM  Take 1 tablet (50 mg total) by mouth every 6 (six) hours as needed.          Diet and Activity recommendation: See Discharge Instructions above    Major procedures and Radiology Reports - PLEASE review detailed and final reports for all details, in brief -     Ct Angio Chest Pe W/cm &/or Wo Cm  09/17/2014   CLINICAL DATA:  Shortness of breath, tachycardia.  EXAM: CT ANGIOGRAPHY CHEST WITH CONTRAST  TECHNIQUE: Multidetector CT imaging of the chest was performed using the standard protocol during bolus administration of intravenous contrast. Multiplanar CT image reconstructions and MIPs were obtained to evaluate the vascular anatomy.  CONTRAST:  OMNIPAQUE IOHEXOL 350 MG/ML SOLN  COMPARISON:  None.  FINDINGS: Filling defects noted in the right lower lobe pulmonary artery compatible with acute pulmonary embolus. Pulmonary embolus also likely extends into the right upper lobe pulmonary artery. No pulmonary emboli on the left.  Heart is normal size. Aorta is normal caliber. No mediastinal, hilar, or axillary adenopathy. Chest wall soft tissues are unremarkable.  Linear areas of atelectasis in the left lower lobe and right middle lobe. No pleural effusions.  NG tube is in place with the tip in the stomach.  Review of the MIP images confirms the above findings.  IMPRESSION: Small to medium sized pulmonary emboli noted in the right upper lobe and right lower lobe pulmonary arteries.  Areas of atelectasis in the left lower lobe and right middle lobe.  NG tube in the stomach.  These results will be called to the ordering clinician or representative by the Radiologist Assistant, and communication documented in the PACS or zVision Dashboard.   Electronically Signed   By: Charlett Nose M.D.   On: 09/17/2014 12:25   Ct  Abdomen Pelvis W Contrast  09/13/2014   CLINICAL DATA:  Vomiting, abdominal pain  EXAM: CT ABDOMEN AND PELVIS WITH CONTRAST  TECHNIQUE: Multidetector CT imaging of the abdomen and pelvis was performed using the standard protocol following bolus administration of intravenous contrast.  CONTRAST:  OMNIPAQUE IOHEXOL 300 MG/ML  SOLN  COMPARISON:  None.  FINDINGS: Lower chest: Curvilinear bilateral lower lobe scarring or atelectasis. Streak artifact from the patient's left arm obscures detail.  Hepatobiliary: Liver is unremarkable allowing for motion artifact and streak artifact. Gallbladder are unremarkable.  Pancreas: Normal  Spleen: Normal  Adrenals/Urinary Tract: Numerous bilateral renal cortical cysts are identified, largest right upper renal pole 3.4 cm image 15. No hydroureteronephrosis. Adrenal glands are unremarkable. No radiopaque renal or ureteral calculus. Bladder is unremarkable.  Stomach/Bowel: There is distal small bowel obstruction with the point of transition at the level of the terminal ileum as it traverses a complex fat and bowel containing ventral abdominal wall hernia. Example, image 50 series 2. Small bowel feces sign is evident. There is fat stranding surrounding the loops of bowel within the hernia. The colon is decompressed. Normal appendix, image 58.  Vascular/Lymphatic: No aortic aneurysm.  No  lymphadenopathy.  Other: No free air or fluid.  Musculoskeletal: Lower lumbar spine disc degenerative change. No acute osseous abnormality.  IMPRESSION: Distal small bowel obstruction the transition point at the level of the terminal ileum secondary to incarceration within anterior abdominal wall hernia. Stranding is noted surrounding loops of bowel within the hernia and this does confer a risk of ischemia.   Electronically Signed   By: Christiana Pellant M.D.   On: 09/13/2014 14:52   Dg Abd 2 Views  09/16/2014   CLINICAL DATA:  Partial small bowel obstruction.  Distended abdomen.  EXAM: ABDOMEN -  2 VIEW  COMPARISON:  09/14/2014  FINDINGS: Interval placement of enteric tube with tip just below the expected region of the gastroesophageal junction as this could be advanced 8-10 cm.  Continued evidence of multiple air-filled dilated small bowel loops with differential air-fluid levels. Small bowel measures up to 5.7 cm in diameter. Appearance slightly worse overall compared to the previous exam. Minimal air throughout the colon. No definite free peritoneal air. Remainder of the exam is unchanged.  IMPRESSION: Continued evidence of small bowel obstruction with slight overall interval worsening.  Enteric tube with tip just below the expected region of the gastroesophageal junction. This could be advanced another 8-10 cm.   Electronically Signed   By: Elberta Fortis M.D.   On: 09/16/2014 09:48   Dg Abd 2 Views  09/14/2014   CLINICAL DATA:  Abdominal distention, small bowel obstruction. Recurrent small bowel obstructions secondary to hernia repair  EXAM: ABDOMEN - 2 VIEW  COMPARISON:  09/13/2014 CT  FINDINGS: There multiple dilated loopsdilated loops of small bowel measuring up to 5 cm. The number of loops of dilated small bowel appears increased from CT topogram 1 day prior. On the upright exam there are multiple short air-fluid levels within the small bowel. There is minimal gas within the colon. Small amount a gas in the rectum.  IMPRESSION: 1. No improvement in small bowel obstruction pattern. 2. Small  gas within the rectum. 3. No free air.   Electronically Signed   By: Genevive Bi M.D.   On: 09/14/2014 10:16   Dg Abd Portable 1v  09/18/2014   CLINICAL DATA:  Small-bowel obstruction.  EXAM: PORTABLE ABDOMEN - 1 VIEW  COMPARISON:  09/16/2014.  FINDINGS: NG tube in stable position in the upper most portion of the stomach. Interim partial resolution of small bowel distention. Stool noted in colon. No free air. Pelvic calcifications consistent phleboliths. Degenerative change lumbar spine and both hips   IMPRESSION: Interim partial resolution of small bowel distention. Stool noted throughout the colon. No free air.   Electronically Signed   By: Maisie Fus  Register   On: 09/18/2014 09:52    Micro Results     Recent Results (from the past 240 hour(s))  MRSA PCR Screening     Status: None   Collection Time: 09/15/14  9:58 AM  Result Value Ref Range Status   MRSA by PCR NEGATIVE NEGATIVE Final    Comment:        The GeneXpert MRSA Assay (FDA approved for NASAL specimens only), is one component of a comprehensive MRSA colonization surveillance program. It is not intended to diagnose MRSA infection nor to guide or monitor treatment for MRSA infections.        Today   Subjective:   Jesus Jesus [name] today has no headache,no chest abdominal pain,no new weakness tingling or numbness, feels much better wants to go home today.   Objective:  Blood pressure 128/73, pulse 111, temperature 98.4 F (36.9 C), temperature source Oral, resp. rate 20, height 5\' 11"  (1.803 m), weight 109.6 kg (241 lb 10 oz), SpO2 100 %.   Intake/Output Summary (Last 24 hours) at 09/21/14 1236 Last data filed at 09/21/14 0622  Gross per 24 hour  Intake 1977.5 ml  Output    350 ml  Net 1627.5 ml    Exam  Awake Alert, Oriented X 3, No new F.N deficits, Normal affect [place].AT,PERRAL Supple Neck,No JVD, No cervical lymphadenopathy appriciated.  Symmetrical Chest wall movement, Good air movement bilaterally, CTAB Tachycardic,No Gallops,Rubs or new Murmurs, No Parasternal Heave +ve B.Sounds, Abd Soft, No tenderness, No organomegaly appriciated, No rebound - guarding or rigidity. hernia is reduced. No Cyanosis, Clubbing or edema, No new Rash or bruise  Data Review   CBC w Diff: Lab Results  Component Value Date   WBC 8.6 09/21/2014   HGB 12.8* 09/21/2014   HCT 38.7* 09/21/2014   PLT 165 09/21/2014   LYMPHOPCT 26 05/07/2012   MONOPCT 6 05/07/2012   EOSPCT 1 05/07/2012   BASOPCT 0 05/07/2012    CMP:  Lab Results  Component Value Date   NA 137 09/21/2014   K 3.3* 09/21/2014   CL 104 09/21/2014   CO2 26 09/21/2014   BUN 16 09/21/2014   CREATININE 0.94 09/21/2014   PROT 7.8 09/13/2014   ALBUMIN 4.6 09/13/2014   BILITOT 1.1 09/13/2014   ALKPHOS 70 09/13/2014   AST 28 09/13/2014   ALT 25 09/13/2014  .   Total Time in preparing paper work, data evaluation and todays exam - 35 minutes  Adalene Gulotta M.D on 09/21/2014 at 12:36 PM  [facility]   Office  [phone]

## 2014-09-21 NOTE — Progress Notes (Signed)
Pt ambulated in hallway without o2 and sat was 94-97. Heart rate 119.

## 2014-09-21 NOTE — Evaluation (Addendum)
Physical Therapy Evaluation Patient Details Name: Jesus [name] MRN: [id number] DOB: 1945-04-12 Today's Date: 09/21/2014   History of Present Illness  Pt admitted 09/13/14 through ED with SBO.  Planned repair of incarcerated umbilical hernia but deferred with development of tachycardia, hypertensive urgency and PEs in R UL and R LL.  Clinical Impression  Pt admitted as above and presenting with functional mobility limitations 2* generalized weakness, poor activity tolerance, and ambulatory instability.  Pt will benefit from acute stay PT to maximize IND and safety prior to return home.  Pt may require follow up HHPT and use of RW for home use dependent on acute stay progress.  On initial evaluation, pt desat to 86% with HR elevated to 155 after ambulating <60'.    Follow Up Recommendations Home health PT;No PT follow up (Dependent on acute stay progress)    Equipment Recommendations  Rolling walker with 5" wheels    Recommendations for Other Services       Precautions / Restrictions Precautions Precautions: Fall Restrictions Weight Bearing Restrictions: No      Mobility  Bed Mobility               General bed mobility comments: NT - OOB with nursing  Transfers Overall transfer level: Needs assistance Equipment used: None Transfers: Sit to/from Stand Sit to Stand: Min assist         General transfer comment: min assist to rise and steady  Ambulation/Gait Ambulation/Gait assistance: Min assist Ambulation Distance (Feet): 58 Feet Assistive device: None;Rolling walker (2 wheeled) Gait Pattern/deviations: Step-to pattern;Step-through pattern;Decreased step length - right;Decreased step length - left;Shuffle;Wide base of support Gait velocity: decr` Gait velocity interpretation: Below normal speed for age/gender General Gait Details: Ambulated 10' with pt demonstrating wide BOS, shortened stride length and general instability - largely corrected with use of RW to  ambulate additional 48'  Stairs            Wheelchair Mobility    Modified Rankin (Stroke Patients Only)       Balance                                             Pertinent Vitals/Pain Pain Assessment: 0-10 Pain Score: 5  Pain Location: R chest - RN aware Pain Descriptors / Indicators: Aching Pain Intervention(s): Limited activity within patient's tolerance;Monitored during session    Home Living Family/patient expects to be discharged to:: Private residence Living Arrangements: Spouse/significant other Available Help at Discharge: Family Type of Home: Apartment Home Access: Level entry     Home Layout: One level Home Equipment: None      Prior Function Level of Independence: Independent               Hand Dominance        Extremity/Trunk Assessment   Upper Extremity Assessment: Generalized weakness           Lower Extremity Assessment: Generalized weakness      Cervical / Trunk Assessment: Normal  Communication   Communication: No difficulties  Cognition Arousal/Alertness: Awake/alert Behavior During Therapy: WFL for tasks assessed/performed Overall Cognitive Status: Within Functional Limits for tasks assessed                      General Comments      Exercises        Assessment/Plan    PT  Assessment Patient needs continued PT services  PT Diagnosis Difficulty walking   PT Problem List Decreased strength;Decreased activity tolerance;Decreased balance;Decreased mobility;Decreased knowledge of use of DME;Obesity;Pain  PT Treatment Interventions DME instruction;Gait training;Functional mobility training;Therapeutic activities;Therapeutic exercise;Patient/family education   PT Goals (Current goals can be found in the Care Plan section) Acute Rehab PT Goals Patient Stated Goal: Get over this heart stuff  PT Goal Formulation: With patient Time For Goal Achievement: 10/04/14 Potential to Achieve Goals:  Good    Frequency Min 3X/week   Barriers to discharge        Co-evaluation               End of Session Equipment Utilized During Treatment: Gait belt Activity Tolerance: Patient limited by fatigue Patient left: in chair;with call bell/phone within reach;with nursing/sitter in room Nurse Communication: Mobility status;Other (comment) (SaO2 86% on RA and HR 155 with activity.)         Time: 1015-1030 PT Time Calculation (min) (ACUTE ONLY): 15 min   Charges:   PT Evaluation $Initial PT Evaluation Tier I: 1 Procedure     PT G Codes:        Earnest Mcgillis 10/03/14, 11:12 AM

## 2014-09-21 NOTE — Care Management Note (Signed)
Case Management Note  Patient Details  Name: Dasheem Braff MRN: [id number] Date of Birth: October 05, 1945  Subjective/Objective:              Vomiting       Action/Plan: Discharge planning  Expected Discharge Date:  09/16/14               Expected Discharge Plan:     In-House Referral:     Discharge planning Services  CM Consult  Post Acute Care Choice:  Home Health Choice offered to:  Patient  DME Arranged:  Dan Humphreys rolling DME Agency:  [facility]  HH Arranged:  PT [facility] Agency:  [facility]  Status of Service:  Completed, signed off  Medicare Important Message Given:    Date Medicare IM Given:    Medicare IM give by:    Date Additional Medicare IM Given:    Additional Medicare Important Message give by:     If discussed at Long Length of Stay Meetings, dates discussed:    Additional Comments:  CM spoke with patient. Patient provided with list of Home Health Providers. Patient selected [facility] for Home Health Physical Therapy. Tiffany at [facility] notified of the referral from HHPT. James at [facility] notified of order for a rolling walker and will deliver to patient's room.  Antony Haste, RN 09/21/2014, 2:32 PM

## 2014-09-21 NOTE — Progress Notes (Signed)
8 Days Post-Op  Subjective: Doing well Passing flatus Tolerating a soft diet Getting ready to have a BM  Objective: Vital signs in last 24 hours: Temp:  [98.3 F (36.8 C)-98.6 F (37 C)] 98.4 F (36.9 C) (06/26 0510) Pulse Rate:  [111] 111 (06/25 0942) Resp:  [20-33] 20 (06/26 0510) BP: (128-173)/(73-113) 128/73 mmHg (06/26 0510) SpO2:  [90 %-100 %] 100 % (06/26 0510) Last BM Date: 09/17/14  Intake/Output from previous day: 06/25 0701 - 06/26 0700 In: 2197.3 [P.O.:600; I.V.:1597.3] Out: 620 [Urine:620] Intake/Output this shift:    Abdomen soft, NT, ND  Lab Results:   Recent Labs  09/20/14 0740 09/21/14 0517  WBC 11.0* 8.6  HGB 14.3 12.8*  HCT 43.7 38.7*  PLT 184 165   BMET  Recent Labs  09/19/14 0335 09/21/14 0517  NA 143 137  K 3.3* 3.3*  CL 104 104  CO2 27 26  GLUCOSE 88 95  BUN 21* 16  CREATININE 0.94 0.94  CALCIUM 9.0 8.5*   PT/INR No results for input(s): LABPROT, INR in the last 72 hours. ABG No results for input(s): PHART, HCO3 in the last 72 hours.  Invalid input(s): PCO2, PO2  Studies/Results: No results found.  Anti-infectives: Anti-infectives    Start     Dose/Rate Route Frequency Ordered Stop   09/17/14 0600  cefOXitin (MEFOXIN) 2 g in dextrose 5 % 50 mL IVPB  Status:  Discontinued     2 g 100 mL/hr over 30 Minutes Intravenous On call to O.R. 09/16/14 1206 09/16/14 1323   09/17/14 0600  ceFAZolin (ANCEF) IVPB 2 g/50 mL premix  Status:  Discontinued     2 g 100 mL/hr over 30 Minutes Intravenous  Once 09/16/14 1319 09/17/14 1510      Assessment/Plan: s/p Procedure(s): LAPAROSCOPIC TRANSABDOMINAL HERNIA (N/A) INSERTION OF MESH (N/A)  SBO from hernia resolved, PE  From a surgical standpoint, he is ready for discharge.  Will need to f/u with Dr. Abbey Chatters with [facility] after discharge regarding his hernia and eventual repair.  LOS: 6 days    Shahed Yeoman A 09/21/2014

## 2014-09-22 LAB — FACTOR 5 LEIDEN

## 2014-09-22 LAB — PROTHROMBIN GENE MUTATION

## 2014-09-22 NOTE — Care Management Note (Signed)
Case Management Note  Patient Details  Name: Jesus HirschfeldRonald [name] MRN: [id number] Date of Birth: [birth year]  Subjective/Objective:                    Action/Plan:   Expected Discharge Date:  09/16/14     [id number]          Expected Discharge Plan:     In-House Referral:     Discharge planning Services  CM Consult  Post Acute Care Choice:  Home Health Choice offered to:  Patient  DME Arranged:  [facility] DME Agency:  [facility]  [facility]  Status of Service:  Completed, signed off  Medicare Important Message Given:    Date Medicare IM Given:    Medicare IM give by:    Date Additional Medicare IM Given:    Additional Medicare Important Message give by:     If discussed at Long Length of Stay Meetings, dates discussed:    Additional Comments:  Golda AcreDavis, Rhonda Lynn, RN 09/22/2014, 8:41 AM

## 2014-10-22 ENCOUNTER — Institutional Professional Consult (permissible substitution): Payer: PRIVATE HEALTH INSURANCE | Admitting: Internal Medicine

## 2016-05-10 IMAGING — CT CT ANGIO CHEST
3 of 7 series · 19 of 36 positions shown · IV contrast (OMNIPAQUE)
Comparison: None.

CLINICAL DATA: Shortness of breath, tachycardia.

EXAM:
CT ANGIOGRAPHY CHEST WITH CONTRAST
TECHNIQUE: Multidetector CT imaging of the chest was performed using the
standard protocol during bolus administration of intravenous
contrast. Multiplanar CT image reconstructions and MIPs were
obtained to evaluate the vascular anatomy.
CONTRAST:  100mL OMNIPAQUE IOHEXOL 350 MG/ML SOLN

[Series 6: pe thins @ 1mm · axial · 0.83mm/px · z∈[-89,+169]mm · 15 of 296 slices shown]
[im 19/296  lung]
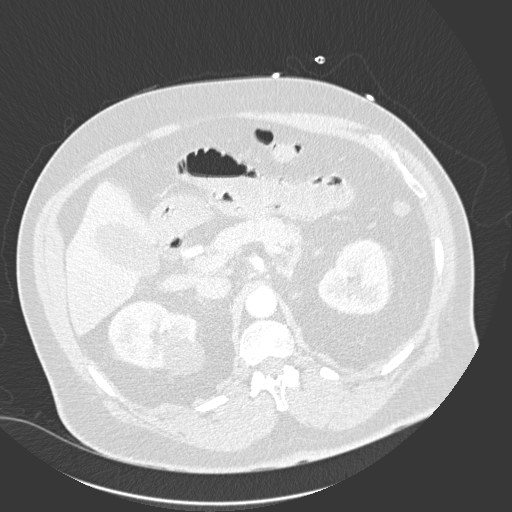
[im 37/296  mediastinal]
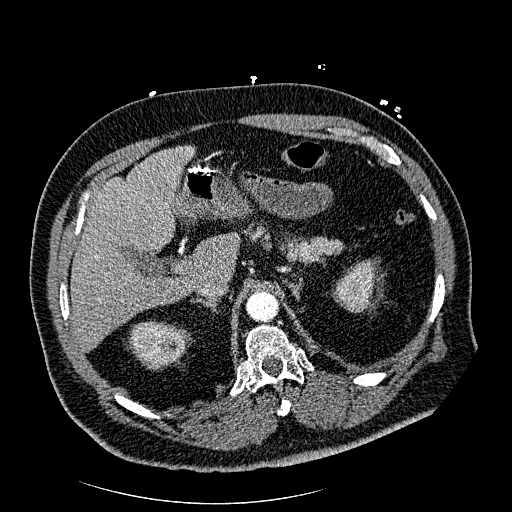
[im 56/296  lung]
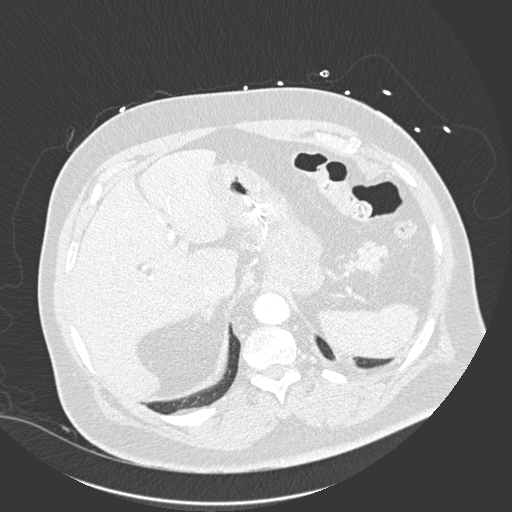
[im 74/296  mediastinal]
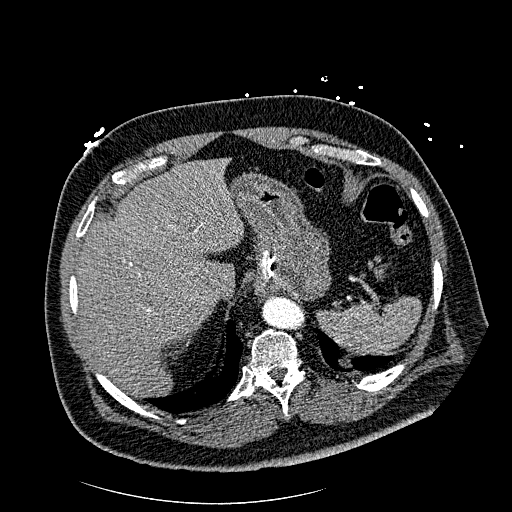
[im 93/296  lung]
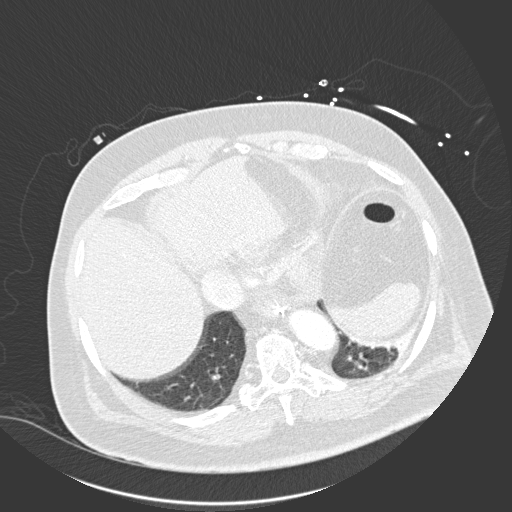
[im 111/296  mediastinal]
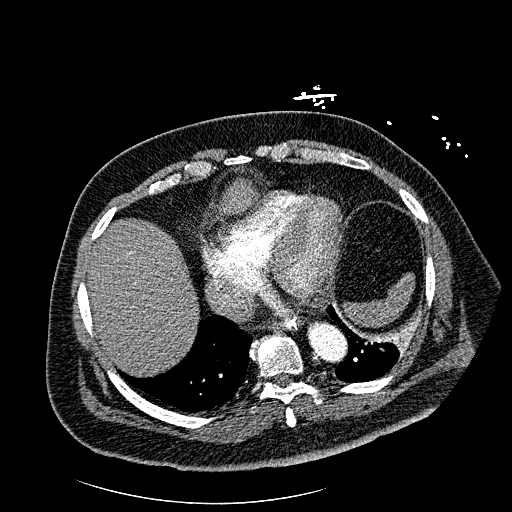
[im 130/296  lung]
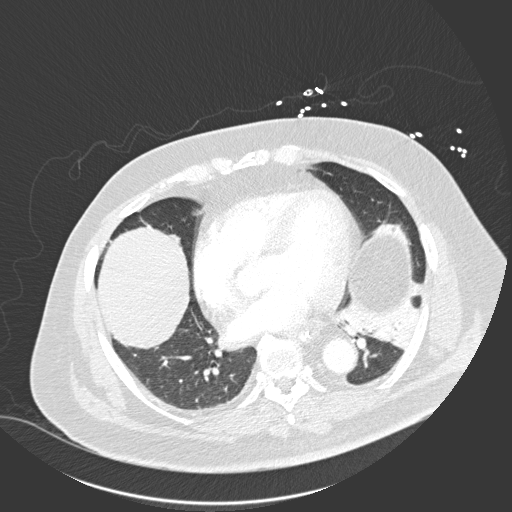
[im 148/296  mediastinal]
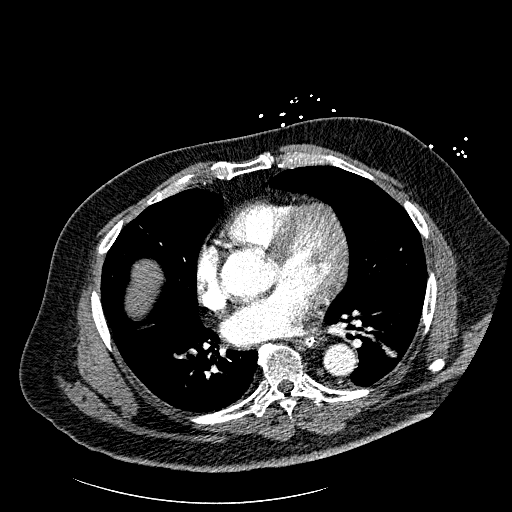
[im 166/296  lung]
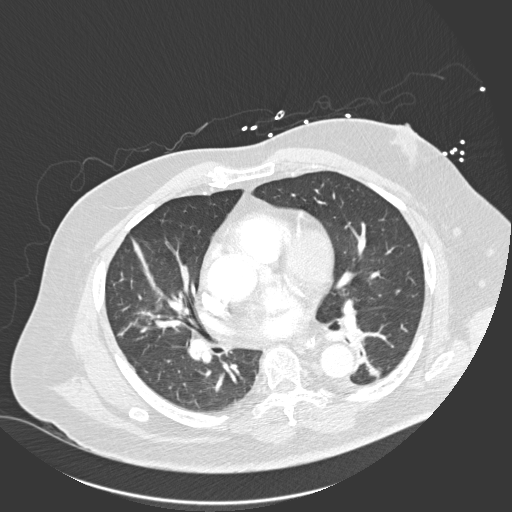
[im 185/296  mediastinal]
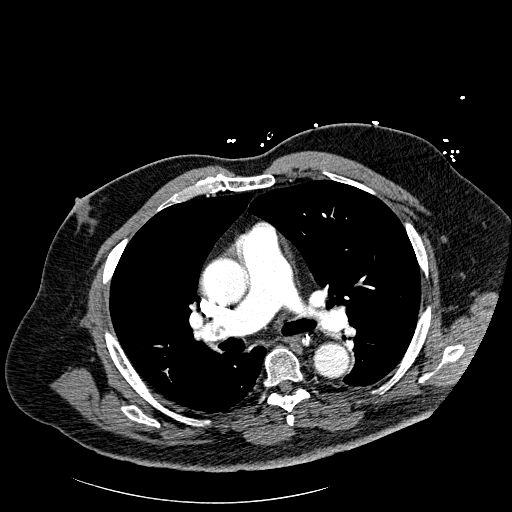
[im 203/296  lung]
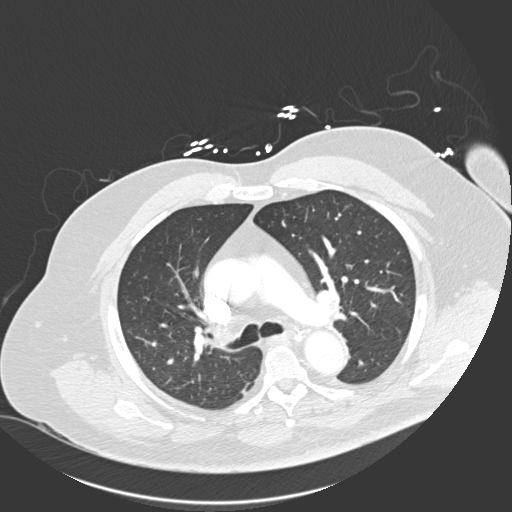
[im 222/296  mediastinal]
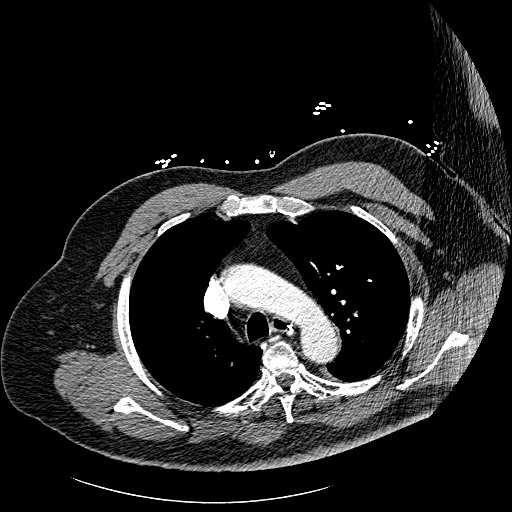
[im 240/296  lung]
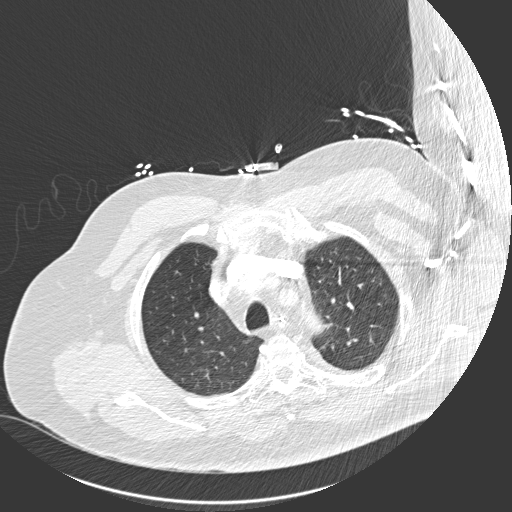
[im 259/296  mediastinal]
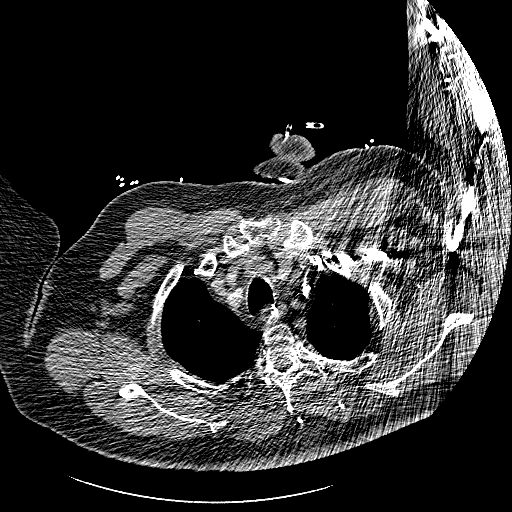
[im 277/296  lung]
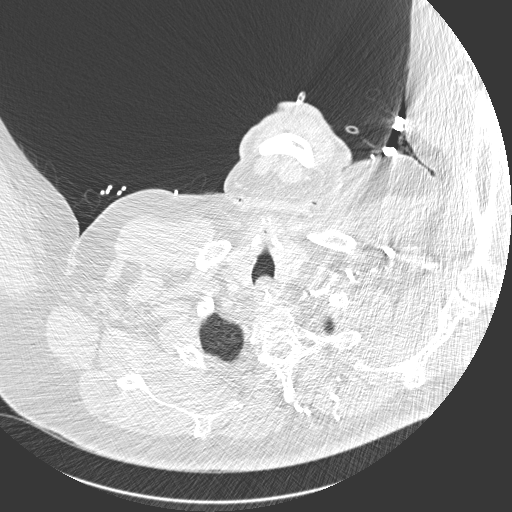

[Series 7: lung windows · axial · 0.83mm/px · z∈[-19,+119]mm · 3 of 92 slices shown]
[im 23/92  mediastinal]
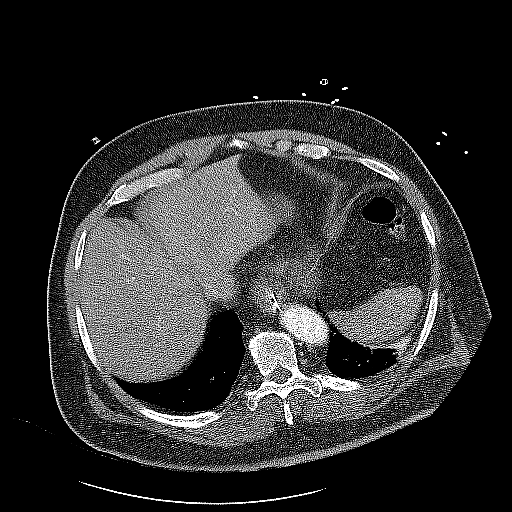
[im 46/92  mediastinal]
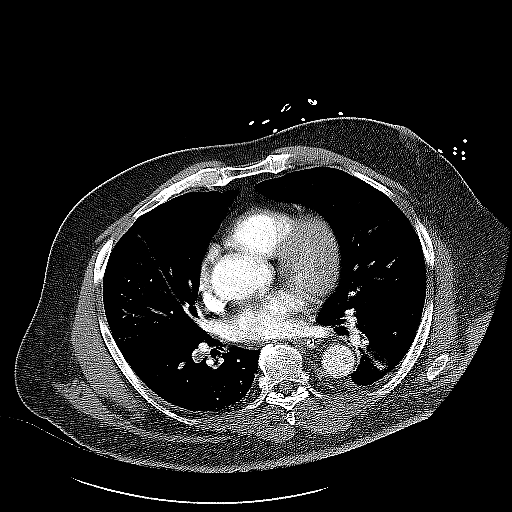
[im 69/92  mediastinal]
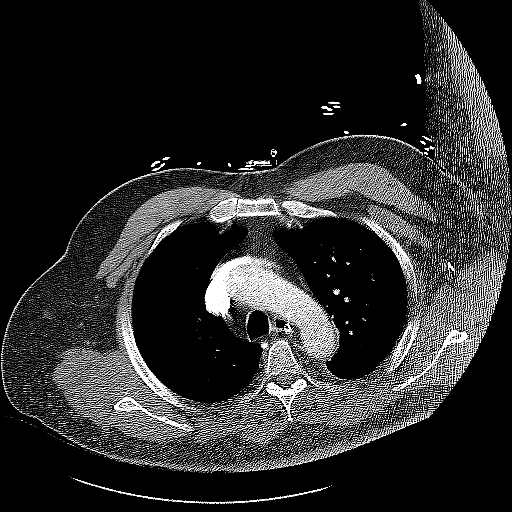

[Series 602: <mpr thick range> · coronal · 0.83mm/px · 1 of 86 slices shown]
[im 43/86  mediastinal]
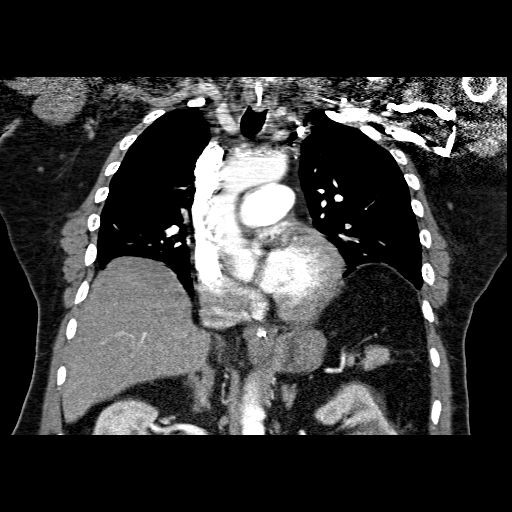

[19 of 36 positions shown; findings below may reference images not displayed]

FINDINGS: Filling defects noted in the right lower lobe pulmonary artery
compatible with acute pulmonary embolus. Pulmonary embolus also
likely extends into the right upper lobe pulmonary artery. No
pulmonary emboli on the left.

Heart is normal size. Aorta is normal caliber. No mediastinal,
hilar, or axillary adenopathy. Chest wall soft tissues are
unremarkable.

Linear areas of atelectasis in the left lower lobe and right middle
lobe. No pleural effusions.

NG tube is in place with the tip in the stomach.

Review of the MIP images confirms the above findings.
IMPRESSION: Small to medium sized pulmonary emboli noted in the right upper lobe
and right lower lobe pulmonary arteries.

Areas of atelectasis in the left lower lobe and right middle lobe.

NG tube in the stomach.

These results will be called to the ordering clinician or
representative by the Radiologist Assistant, and communication
documented in the PACS or zVision Dashboard.

## 2016-11-20 ENCOUNTER — Emergency Department (HOSPITAL_COMMUNITY): Payer: Medicare Other

## 2016-11-20 ENCOUNTER — Encounter (HOSPITAL_COMMUNITY): Payer: Self-pay

## 2016-11-20 ENCOUNTER — Emergency Department (HOSPITAL_COMMUNITY)
Admission: EM | Admit: 2016-11-20 | Discharge: 2016-11-20 | Disposition: A | Discharge status: Home / Self Care | Payer: Medicare Other | Attending: Emergency Medicine | Admitting: Emergency Medicine

## 2016-11-20 DIAGNOSIS — J40 Bronchitis, not specified as acute or chronic: Secondary | ICD-10-CM | POA: Diagnosis not present

## 2016-11-20 DIAGNOSIS — Z7901 Long term (current) use of anticoagulants: Secondary | ICD-10-CM | POA: Insufficient documentation

## 2016-11-20 DIAGNOSIS — R0602 Shortness of breath: Secondary | ICD-10-CM | POA: Diagnosis present

## 2016-11-20 DIAGNOSIS — Z79899 Other long term (current) drug therapy: Secondary | ICD-10-CM | POA: Diagnosis not present

## 2016-11-20 DIAGNOSIS — I10 Essential (primary) hypertension: Secondary | ICD-10-CM | POA: Diagnosis not present

## 2016-11-20 DIAGNOSIS — Z87891 Personal history of nicotine dependence: Secondary | ICD-10-CM | POA: Insufficient documentation

## 2016-11-20 LAB — CBC WITH DIFFERENTIAL/PLATELET
BASOS ABS: 0 10*3/uL (ref 0.0–0.1)
Basophils Relative: 0 %
Eosinophils Absolute: 0.4 10*3/uL (ref 0.0–0.7)
Eosinophils Relative: 4 %
HEMATOCRIT: 45.6 % (ref 39.0–52.0)
HEMOGLOBIN: 16.1 g/dL (ref 13.0–17.0)
LYMPHS ABS: 1.7 10*3/uL (ref 0.7–4.0)
LYMPHS PCT: 17 %
MCH: 29.7 pg (ref 26.0–34.0)
MCHC: 35.3 g/dL (ref 30.0–36.0)
MCV: 84.1 fL (ref 78.0–100.0)
Monocytes Absolute: 0.5 10*3/uL (ref 0.1–1.0)
Monocytes Relative: 5 %
NEUTROS ABS: 7.4 10*3/uL (ref 1.7–7.7)
NEUTROS PCT: 74 %
Platelets: 173 10*3/uL (ref 150–400)
RBC: 5.42 MIL/uL (ref 4.22–5.81)
RDW: 13.9 % (ref 11.5–15.5)
WBC: 10 10*3/uL (ref 4.0–10.5)

## 2016-11-20 LAB — BASIC METABOLIC PANEL
ANION GAP: 8 (ref 5–15)
BUN: 13 mg/dL (ref 6–20)
CHLORIDE: 109 mmol/L (ref 101–111)
CO2: 22 mmol/L (ref 22–32)
Calcium: 9.5 mg/dL (ref 8.9–10.3)
Creatinine, Ser: 0.92 mg/dL (ref 0.61–1.24)
GFR calc Af Amer: >60 mL/min (ref >60)
GFR calc non Af Amer: >60 mL/min (ref >60)
GLUCOSE: 123 mg/dL — AB (ref 65–99)
POTASSIUM: 4.1 mmol/L (ref 3.5–5.1)
Sodium: 139 mmol/L (ref 135–145)

## 2016-11-20 LAB — D-DIMER, QUANTITATIVE: D-Dimer, Quant: 1.09 ug/mL-FEU — ABNORMAL HIGH (ref 0.00–0.50)

## 2016-11-20 MED ORDER — ALBUTEROL (5 MG/ML) CONTINUOUS INHALATION SOLN
10.0000 mg/h | INHALATION_SOLUTION | Freq: Once | RESPIRATORY_TRACT | Status: AC
  Administered 2016-11-20: 10 mg/h via RESPIRATORY_TRACT
  Filled 2016-11-20: qty 20

## 2016-11-20 MED ORDER — METHYLPREDNISOLONE SODIUM SUCC 125 MG IJ SOLR
125.0000 mg | Freq: Once | INTRAMUSCULAR | Status: AC
  Administered 2016-11-20: 125 mg via INTRAVENOUS
  Filled 2016-11-20: qty 2

## 2016-11-20 MED ORDER — PREDNISONE 50 MG PO TABS: ORAL_TABLET | ORAL | 0 refills | Status: DC

## 2016-11-20 MED ORDER — IOPAMIDOL (ISOVUE-370) INJECTION 76%
100.0000 mL | Freq: Once | INTRAVENOUS | Status: AC | PRN
  Administered 2016-11-20: 100 mL via INTRAVENOUS

## 2016-11-20 MED ORDER — IOPAMIDOL (ISOVUE-370) INJECTION 76%
INTRAVENOUS | Status: AC
  Filled 2016-11-20: qty 100

## 2016-11-20 MED ORDER — ALBUTEROL SULFATE (2.5 MG/3ML) 0.083% IN NEBU
5.0000 mg | INHALATION_SOLUTION | Freq: Once | RESPIRATORY_TRACT | Status: AC
  Administered 2016-11-20: 5 mg via RESPIRATORY_TRACT
  Filled 2016-11-20: qty 6

## 2016-11-20 MED ORDER — PREDNISONE 20 MG PO TABS
60.0000 mg | ORAL_TABLET | Freq: Once | ORAL | Status: DC
Start: 2016-11-20 — End: 2016-11-20

## 2016-11-20 MED ORDER — ALBUTEROL SULFATE HFA 108 (90 BASE) MCG/ACT IN AERS
2.0000 | INHALATION_SPRAY | RESPIRATORY_TRACT | Status: DC
  Administered 2016-11-20: 2 via RESPIRATORY_TRACT
  Filled 2016-11-20: qty 6.7

## 2016-11-20 NOTE — ED Provider Notes (Signed)
[facility]-EMERGENCY DEPT Provider Note   CSN: [id number] Arrival date & time: 11/20/16  0554     History   Chief Complaint Chief Complaint  Patient presents with  . Shortness of Breath    HPI Jesus [name] is a 71 y.o. male.  71 year old male presents with several days of cough wheezing or shortness of breath. Denies any chest pain or chest pressure. No lower extremity edema. No prior history of CHF. No fever or chills. Has used a home inhaler with good relief. 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 He chronically takes Eilquis. And states he's been compliant      Past Medical History:  Diagnosis Date  . Anxiety   . Hydrocele, right   . Hypertension   . Spermatocele   . Urinary hesitancy     Patient Active Problem List   Diagnosis Date Noted  . Acute pulmonary embolism (HCC) 09/17/2014  . 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 Tachycardia 09/17/2014  . Elevated troponin 09/17/2014  . Small bowel obstruction (HCC) 09/13/2014  . Hydrocele, right 06/20/2011  . 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 HYDROCELE EXCISION  06/20/2011   Procedure: HYDROCELECTOMY ADULT;  Surgeon: Valetta Fuller, MD;  Location: [facility];  Service: Urology;  Laterality: Right;  . SCROTAL EXPLORATION  06/20/2011   Procedure: SCROTUM EXPLORATION;  Surgeon: Valetta Fuller, MD;  Location: [facility];  Service: Urology;  Laterality: Right;  . SPERMATOCELECTOMY  06/20/2011   Procedure: SPERMATOCELECTOMY;  Surgeon: Valetta Fuller, MD;  Location: [facility];  Service: Urology;  Laterality: Right;  . UMBILICAL HERNIA REPAIR  1998       Home Medications    Prior to Admission medications   Medication Sig Start Date End Date Taking? Authorizing Provider  apixaban (ELIQUIS) 5 MG TABS tablet Please take  2 tablets (10 mg) oral two times  daily for 6 days (until  09/26/2014), then take 1 tablet (5 mg) oral two times daily thereafter. 09/21/14   Elgergawy, Leana Roe, MD  cyclobenzaprine (FLEXERIL) 10 MG tablet Take 1 tablet (10 mg total) by mouth 2 (two) times daily as needed for muscle spasms. Patient not taking: Reported on 09/13/2014 05/19/14   Lurene Shadow, PA-C  dextromethorphan (DELSYM) 30 MG/5ML liquid Take 2.5 mLs (15 mg total) by mouth 2 (two) times daily. Patient not taking: Reported on 05/19/2014 08/31/13   Roxy Horseman, PA-C  guaiFENesin (ROBITUSSIN) 100 MG/5ML liquid Take 5-10 mLs (100-200 mg total) by mouth every 4 (four) hours as needed for cough. Patient not taking: Reported on 09/13/2014 05/19/14   Lurene Shadow, PA-C  HYDROcodone-acetaminophen (NORCO/VICODIN) 5-325 MG per tablet Take 1-2 tablets by mouth every 4 (four) hours as needed for moderate pain. 09/21/14   Elgergawy, Leana Roe, MD  LORazepam (ATIVAN) 1 MG tablet Take 0.5 mg by mouth 2 (two) times daily.     [provider]  metoprolol succinate (TOPROL XL) 25 MG 24 hr tablet Take 1 tablet (25 mg total) by mouth every evening. 09/21/14   Elgergawy, Leana Roe, MD  metoprolol succinate (TOPROL XL) 50 MG 24 hr tablet Take 1 tablet (50 mg total) by mouth daily. Take with or immediately following a meal. 09/21/14   Elgergawy, Leana Roe, MD  Multiple Vitamin (MULTIVITAMIN WITH MINERALS) TABS tablet Take 1 tablet by mouth daily.    [provider]  oxymetazoline Vickie Epley)  0.05 % nasal spray Place 1 spray into both nostrils 2 (two) times daily as needed for congestion.    [provider]  potassium chloride (K-DUR) 10 MEQ tablet Take 2 tablets (20 mEq total) by mouth daily. 09/21/14   Elgergawy, Leana Roe, MD  Tamsulosin HCl (FLOMAX) 0.4 MG CAPS Take 0.4 mg by mouth daily.    [provider]  traMADol (ULTRAM) 50 MG tablet Take 1 tablet (50 mg total) by mouth every 6 (six) hours as needed. Patient not taking: Reported on 09/13/2014 05/19/14   Rolla Plate     Family History Family History  Problem Relation Age of Onset  . Cancer Father   . Healthy Sister   . Healthy Brother   . Healthy Sister     Social History Social History  Substance Use Topics  . Smoking status: Former Smoker    Types: Cigarettes    Quit date: 03/28/1958  . Smokeless tobacco: Never Used  . Alcohol use Yes     Comment: 1-2 drinks per week     Allergies   Patient has no known allergies.   Review of Systems Review of Systems  All other systems reviewed and are negative.    Physical Exam Updated Vital Signs BP (!) 192/127 (BP Location: Left Arm)   Pulse (!) 111   Temp 99.2 F (37.3 C) (Oral)   Resp 20   Ht 1.829 m (6')   Wt 108.9 kg (240 lb)   SpO2 94%   BMI 32.55 kg/m   Physical Exam  Constitutional: He is oriented to person, place, and time. He appears well-developed and well-nourished.  Non-toxic appearance. No distress.  HENT:  Head: Normocephalic and atraumatic.  Eyes: Pupils are equal, round, and reactive to light. Conjunctivae, EOM and lids are normal.  Neck: Normal range of motion. Neck supple. No tracheal deviation present. No thyroid mass present.  Cardiovascular: Normal rate, regular rhythm and normal heart sounds.  Exam reveals no gallop.   No murmur heard. Pulmonary/Chest: Effort normal. No stridor. Tachypnea noted. No respiratory distress. He has decreased breath sounds in the right lower field and the left lower field. He has wheezes in the right lower field and the left lower field. He has no rhonchi. He has no rales.  Abdominal: Soft. Normal appearance and bowel sounds are normal. He exhibits no distension. There is no tenderness. There is no rebound and no CVA tenderness.  Musculoskeletal: Normal range of motion. He exhibits no edema or tenderness.  Neurological: He is alert and oriented to person, place, and time. He has normal strength. No cranial nerve deficit or sensory deficit. GCS eye subscore is 4. GCS verbal subscore is  5. GCS motor subscore is 6.  Skin: Skin is warm and dry. No abrasion and no rash noted.  Psychiatric: He has a normal mood and affect. His speech is normal and behavior is normal.  Nursing note and vitals reviewed.    ED Treatments / Results  Labs (all labs ordered are listed, but only abnormal results are displayed) Labs Reviewed - No data to display  EKG  EKG Interpretation None       Radiology Dg Chest 2 View  Result Date: 11/20/2016 CLINICAL DATA:  Shortness of breath EXAM: CHEST  2 VIEW COMPARISON:  Chest CT 09/17/2014 FINDINGS: The heart size and mediastinal contours are within normal limits. Left basilar opacities, likely atelectasis. No focal consolidation. The visualized skeletal structures are unremarkable. IMPRESSION: Left basilar atelectasis. Electronically Signed  By: Deatra Robinson M.D.   On: 11/20/2016 06:31    Procedures Procedures (including critical care time)  Medications Ordered in ED Medications  predniSONE (DELTASONE) tablet 60 mg (not administered)  albuterol (PROVENTIL,VENTOLIN) solution continuous neb (not administered)  albuterol (PROVENTIL) (2.5 MG/3ML) 0.083% nebulizer solution 5 mg (5 mg Nebulization Given 11/20/16 0636)     Initial Impression / Assessment and Plan / ED Course  I have reviewed the triage vital signs and the nursing notes.  Pertinent labs & imaging results that were available during my care of the patient were reviewed by me and considered in my medical decision making (see chart for details).     Patient malleus tachycardic on my arrival likely from receiving albuterol prior to me seeing him. Given steroids here as well as another continuous treatment. Did have an elevated d-dimer. Given his history of PE he had a CT of the chest which was negative for embolism or infection.  Patient feels better and likely a bronchospasm and will discharge to home.  Final Clinical Impressions(s) / ED Diagnoses   Final diagnoses:  None     New Prescriptions New Prescriptions   No medications on file     Lorre Nick, MD 11/20/16 1205

## 2016-11-20 NOTE — ED Triage Notes (Signed)
Since Thursday sob worsening and wheezing noted hard to speak in full sentences non productive cough no fever voiced.

## 2016-11-20 NOTE — ED Notes (Signed)
Bed: WA13 Expected date:  Expected time:  Means of arrival:  Comments: 

## 2016-11-20 NOTE — ED Notes (Signed)
Pt ambulated to restroom without assistance and without shortness of breath

## 2018-08-27 DEATH — deceased

## 2019-04-05 ENCOUNTER — Ambulatory Visit: Payer: Medicare Other | Admitting: Cardiology

## 2019-05-08 ENCOUNTER — Ambulatory Visit (INDEPENDENT_AMBULATORY_CARE_PROVIDER_SITE_OTHER): Payer: Medicare Other | Admitting: Cardiology

## 2019-05-08 ENCOUNTER — Encounter: Payer: Self-pay | Admitting: Cardiology

## 2019-05-08 ENCOUNTER — Other Ambulatory Visit: Payer: Self-pay

## 2019-05-08 VITALS — BP 138/92 | HR 82 | Ht 71.0 in | Wt 241.8 lb

## 2019-05-08 DIAGNOSIS — R000 Tachycardia, unspecified: Secondary | ICD-10-CM

## 2019-05-08 DIAGNOSIS — Z86711 Personal history of pulmonary embolism: Secondary | ICD-10-CM | POA: Diagnosis not present

## 2019-05-08 DIAGNOSIS — I10 Essential (primary) hypertension: Secondary | ICD-10-CM

## 2019-05-08 DIAGNOSIS — E669 Obesity, unspecified: Secondary | ICD-10-CM | POA: Diagnosis not present

## 2019-05-08 DIAGNOSIS — R799 Abnormal finding of blood chemistry, unspecified: Secondary | ICD-10-CM

## 2019-05-08 DIAGNOSIS — I2510 Atherosclerotic heart disease of native coronary artery without angina pectoris: Secondary | ICD-10-CM

## 2019-05-08 NOTE — Progress Notes (Signed)
Primary Care Provider: Leilani Able, MD [facility]) Cardiologist: No primary care provider on file. Electrophysiologist: None  Clinic Note: Chief Complaint  Patient presents with  . New Patient (Initial Visit)    Baseline cardiology evaluation  . Hypertension    HPI:    Jesus [name] is a 74 y.o. African-American male with a history of hypertension, pulmonary embolism (on Eliquis) and obesity with OSA who is being seen today for baseline cardiology evaluation at the request of [place] Able, MD.  Jesus [name] was last seen on February 11, 2019 by JesusReese for routine evaluation of hypertension.  He asked for referral to see me for baseline cardiology evaluation.  I have known him for several years now while caring for his wife.  Recent Hospitalizations:  --> He was actually seen in cardiology consultation by Dr. Antoine [name] back in June 2016 when he presented with small bowel obstruction and ended up also having a pulmonary embolism.  Apparently he was planned to undergo surgical repair for small bowel obstruction and ventral hernia when he developed hypertensive urgency tachycardia hypoxia.  He was treated with IV Lopressor and hydralazine.  CT scan showed pulmonary embolism. >  Cardiology was consulted because of elevated troponin which was felt to be related to pulmonary embolism as was his tachycardia.  Reviewed  CV studies:    The following studies were reviewed today: (if available, images/films reviewed: From Epic Chart or Care Everywhere) . 2D Echo September 17, 2014: Normal LV size and function.  EF 55 to 60%.  No R WMA.  RV not well seen.  Interval History:   [place] [name] presents here today first and foremost telling me that he really has no major complaints with cardiac standpoint.  He gained knowledge is that he is not as active as he probably should be but he is not having any issues as far as any chest tightness or pressure with rest or exertion.  He is a  little bit deconditioned so he may get some shortness of breath if he overdoes it.  His blood pressures have been somewhat up and down of late, but has not had any headaches or dizzy woozy spells.  No syncope or near syncope.  No heart failure symptoms.  What he does note though is that he is recently been started on clonidine and that just makes him feel really tired.  He just has had less energy of late.  He is now only taking it once a day at nighttime.  States he has been on it, his blood pressures been pretty well controlled, but he just does not feel well.  CV Review of Symptoms (Summary): no chest pain or dyspnea on exertion positive for - Labile blood pressure negative for - edema, irregular heartbeat, loss of consciousness, orthopnea, palpitations, paroxysmal nocturnal dyspnea, rapid heart rate, shortness of breath or Syncope/near syncope, TIA/amaurosis fugax, claudication.  The patient does not have symptoms concerning for COVID-19 infection (fever, chills, cough, or new shortness of breath).  The patient is practicing social distancing & Masking.    REVIEWED OF SYSTEMS   A comprehensive ROS was performed. Review of Systems  Constitutional: Negative for malaise/fatigue and weight loss.  HENT: Negative for nosebleeds.   Cardiovascular: Positive for leg swelling.  Gastrointestinal: Negative for blood in stool and melena.  Genitourinary: Negative for hematuria.  Musculoskeletal: Negative for falls and joint pain.  Neurological: Negative for dizziness and focal weakness.  Psychiatric/Behavioral: Negative for memory loss. The patient is  not nervous/anxious.    I have reviewed and (if needed) personally updated the patient's problem list, medications, allergies, past medical and surgical history, social and family history.   PAST MEDICAL HISTORY   Past Medical History:  Diagnosis Date  . Anxiety   . History of pulmonary embolism 09/17/2014  . Hydrocele, right   . Hypertension    . Spermatocele   . Urinary hesitancy     PAST SURGICAL HISTORY   Past Surgical History:  Procedure Laterality Date  . HYDROCELE EXCISION  06/20/2011   Procedure: HYDROCELECTOMY ADULT;  Surgeon: [place] Fuller, MD;  Location: [facility];  Service: Urology;  Laterality: Right;  . SCROTAL EXPLORATION  06/20/2011   Procedure: SCROTUM EXPLORATION;  Surgeon: [place] Fuller, MD;  Location: [facility];  Service: Urology;  Laterality: Right;  . SPERMATOCELECTOMY  06/20/2011   Procedure: SPERMATOCELECTOMY;  Surgeon: [place] Fuller, MD;  Location: [facility];  Service: Urology;  Laterality: Right;  . UMBILICAL HERNIA REPAIR  1998    MEDICATIONS/ALLERGIES   Current Meds  Medication Sig  . aliskiren (TEKTURNA) 150 MG tablet Take 150 mg by mouth daily.  [name] apixaban (ELIQUIS) 5 MG TABS tablet Please take  2 tablets (10 mg) oral two times  daily for 6 days (until 09/26/2014), then take 1 tablet (5 mg) oral two times daily thereafter. (Patient taking differently: Take 5 mg by mouth 2 (two) times daily. )  . Cholecalciferol (D3-1000 PO) Take 1 tablet by mouth daily.  . cloNIDine (CATAPRES) 0.2 MG tablet Take 0.2 mg by mouth daily.  [name] LORazepam (ATIVAN) 2 MG tablet Take 2 mg by mouth every 8 (eight) hours as needed for anxiety.  . Multiple Vitamin (MULTIVITAMIN WITH MINERALS) TABS tablet Take 1 tablet by mouth daily.  . Nebivolol HCl (BYSTOLIC) 20 MG TABS Take 1 tablet by mouth daily.  . Tamsulosin HCl (FLOMAX) 0.4 MG CAPS Take 0.4 mg by mouth daily.    No Known Allergies  SOCIAL HISTORY/FAMILY HISTORY   Social History   Tobacco Use  . Smoking status: Former Smoker    Types: Cigarettes    Quit date: 03/28/1958    Years since quitting: 61.1  . Smokeless tobacco: Never Used  Substance Use Topics  . Alcohol use: Yes    Comment: 1-2 drinks per week  . Drug use: No   Social History   Social History Narrative   Jesus [name] is the husband of [place] [name]  also a patient of [place] [name].   He is retired Hotel manager.    Family History family history includes Cancer in his father; Healthy in his brother, sister, and sister.  OBJCTIVE -PE, EKG, labs   Wt Readings from Last 3 Encounters:  05/08/19 241 lb 12.8 oz (109.7 kg)  11/20/16 240 lb (108.9 kg)  09/17/14 241 lb 10 oz (109.6 kg)    Physical Exam: BP (!) 138/92   Pulse 82   Ht 5\' 11"  (1.803 m)   Wt 241 lb 12.8 oz (109.7 kg)   BMI 33.72 kg/m  Physical Exam  Constitutional: He is oriented to person, place, and time. He appears well-developed and well-nourished. No distress.  Well-nourished.  Well-groomed.  Mild obese  HENT:  Head: Normocephalic and atraumatic.  Neck: No JVD present. Carotid bruit is not present.  Cardiovascular: Normal rate, regular rhythm, normal heart sounds, intact distal pulses and normal pulses.  No extrasystoles are present. PMI is not displaced. Exam reveals no gallop and no  friction rub.  No murmur heard. Pulmonary/Chest: Breath sounds normal. No respiratory distress. He has no wheezes. He has no rales.  Abdominal: Bowel sounds are normal. He exhibits no distension. There is no abdominal tenderness. There is no rebound.  Musculoskeletal:        General: No edema. Normal range of motion.     Cervical back: Normal range of motion and neck supple.  Neurological: He is alert and oriented to person, place, and time. No cranial nerve deficit.  Psychiatric: He has a normal mood and affect. His behavior is normal. Judgment and thought content normal.  Vitals reviewed.    Adult ECG Report  Rate: 82 ;  Rhythm: normal sinus rhythm and Nonspecific ST and T wave changes.;  Normal axis, intervals and durations.  Narrative Interpretation: Relative normal EKG  Recent Labs: No labs available No results found for: CHOL, HDL, LDLCALC, LDLDIRECT, TRIG, CHOLHDL Lab Results  Component Value Date   CREATININE 0.92 11/20/2016   BUN 13 11/20/2016   NA 139 11/20/2016   K 4.1  11/20/2016   CL 109 11/20/2016   CO2 22 11/20/2016   No results found for: TSH  ASSESSMENT/PLAN    Problem List Items Addressed This Visit    History of pulmonary embolism (Chronic)    He is now about 4.5 years out from his pulmonary embolism that happened in the setting of being somewhat immobilized because of his abdominal issues.  He is still on Eliquis which is being managed by his PCP.  I am not aware of whether or not he had a hypercoagulable work-up done.  Will defer to PCP for now.      Relevant Orders   EKG 12-Lead (Completed)   Essential hypertension (Chronic)    His blood pressure actually does not look too bad today.  I do agree that clonidine may not be the greatest option for treating his blood pressure. He apparently had some palpitations in the past and that is why is on Bystolic.  These seem to be pretty well controlled now. He is on Tekturna, but has yet not on diuretic.  For now I think we will just continue current meds but I would probably consider using clonidine for as needed purposes and potentially add a baseline diuretic.  Will readdress when we see him back in follow-up depending on what his blood pressure looks like..      Relevant Medications   Nebivolol HCl (BYSTOLIC) 20 MG TABS   aliskiren (TEKTURNA) 150 MG tablet   cloNIDine (CATAPRES) 0.2 MG tablet   Tachycardia (Chronic)    Heart rate seems to be pretty well controlled on current dose of Bystolic.      Relevant Orders   EKG 12-Lead (Completed)   CT CARDIAC SCORING   Comprehensive metabolic panel   Obesity (BMI 54.2-70.6) (Chronic)    We talk about the importance of dietary modification and exercise to reduce risk factors.      Relevant Orders   Lipid panel   Comprehensive metabolic panel   Hemoglobin A1c   Cardiovascular disease - Primary (Chronic)    74 year old gentleman with hypertension.  I am not aware of previous lipid evaluation.  He does not member the last time he had labs  checked.   Obesity, hypertension 2 of 3 main components for the diagnosis of metabolic syndrome.  Would like to check lipid panel, A1c and CMP to determine if there is presence of any other risk factors.  Plan for now is  to evaluate baseline cardiovascular risk with a coronary calcium score. We talked about pros and cons of a stress test, with him not having active anginal symptoms, I question the benefit of a screening stress test.  Coronary calcium score is probably more effective determine the extent of Hyperlipidemia. Otherwise would continue to try to manage blood pressure and lipids.  He will need lipids to be checked soon.  Plan: Check coronary calcium score as well as lipid panel chemistry panel and A1c to assess based on risk factors.  Would like to see how much the components of metabolic syndrome he qualifies for.      Relevant Medications   Nebivolol HCl (BYSTOLIC) 20 MG TABS   aliskiren (TEKTURNA) 150 MG tablet   cloNIDine (CATAPRES) 0.2 MG tablet   Other Relevant Orders   EKG 12-Lead (Completed)   CT CARDIAC SCORING   Lipid panel   Comprehensive metabolic panel   Hemoglobin A1c    Other Visit Diagnoses    Abnormal finding of blood chemistry, unspecified        Relevant Orders   Hemoglobin A1c       COVID-19 Education: The signs and symptoms of COVID-19 were discussed with the patient and how to seek care for testing (follow up with PCP or arrange E-visit).   The importance of social distancing was discussed today.  I spent a total of 18 minutes with the patient. >  50% of the time was spent in direct patient consultation.  Additional time spent with chart review  / charting (studies, outside notes, etc): 10 Total Time: 28 min   Current medicines are reviewed at length with the patient today.  (+/- concerns) he is not sure about the different medications he is on.   Patient Instructions / Medication Changes & Studies & Tests Ordered   Patient Instructions   Medication Instructions:  No changes *If you need a refill on your cardiac medications before your next appointment, please call your pharmacy*  Lab Work: Lipid cmp hgbA1c If you have labs (blood work) drawn today and your tests are completely normal, you will receive your results only by: [name] MyChart Message (if you have MyChart) OR . A paper copy in the mail If you have any lab test that is abnormal or we need to change your treatment, we will call you to review the results.  Testing/Procedures:  CT coronary calcium score. This test is done at [address]. This is $150 out of pocket.   Coronary CalciumScan A coronary calcium scan is an imaging test used to look for deposits of calcium and other fatty materials (plaques) in the inner lining of the blood vessels of the heart (coronary arteries). These deposits of calcium and plaques can partly clog and narrow the coronary arteries without producing any symptoms or warning signs. This puts a person at risk for a heart attack. This test can detect these deposits before symptoms develop. Tell a health care provider about:  Any allergies you have.  All medicines you are taking, including vitamins, herbs, eye drops, creams, and over-the-counter medicines.  Any problems you or family members have had with anesthetic medicines.  Any blood disorders you have.  Any surgeries you have had.  Any medical conditions you have.  Whether you are pregnant or may be pregnant. What are the risks? Generally, this is a safe procedure. However, problems may occur, including:  Harm to a pregnant woman and her unborn baby. This test  involves the use of radiation. Radiation exposure can be dangerous to a pregnant woman and her unborn baby. If you are pregnant, you generally should not have this procedure done.  Slight increase in the risk of cancer. This is because of the radiation involved in the test. What happens before the  procedure? No preparation is needed for this procedure. What happens during the procedure?  You will undress and remove any jewelry around your neck or chest.  You will put on a hospital gown.  Sticky electrodes will be placed on your chest. The electrodes will be connected to an electrocardiogram (ECG) machine to record a tracing of the electrical activity of your heart.  A CT scanner will take pictures of your heart. During this time, you will be asked to lie still and hold your breath for 2-3 seconds while a picture of your heart is being taken. The procedure may vary among health care providers and hospitals. What happens after the procedure?  You can get dressed.  You can return to your normal activities.  It is up to you to get the results of your test. Ask your health care provider, or the department that is doing the test, when your results will be ready. Summary  A coronary calcium scan is an imaging test used to look for deposits of calcium and other fatty materials (plaques) in the inner lining of the blood vessels of the heart (coronary arteries).  Generally, this is a safe procedure. Tell your health care provider if you are pregnant or may be pregnant.  No preparation is needed for this procedure.  A CT scanner will take pictures of your heart.  You can return to your normal activities after the scan is done. This information is not intended to replace advice given to you by your health care provider. Make sure you discuss any questions you have with your health care provider. Document Released: 09/10/2007 Document Revised: 02/01/2016 Document Reviewed: 02/01/2016 Elsevier Interactive Patient Education  2017 [place]: At [facility], you and your health needs are our priority.  As part of our continuing mission to provide you with exceptional heart care, we have created designated Provider Care Teams.  These Care Teams include your primary  Cardiologist (physician) and Advanced Practice Providers (APPs -  Physician Assistants and Nurse Practitioners) who all work together to provide you with the care you need, when you need it.  Your next appointment:   1 month(s)  The format for your next appointment:   In Person  Provider:   Glenetta Hew, MD  Other Instructions n/a    Studies Ordered:   Orders Placed This Encounter  Procedures  . CT CARDIAC SCORING  . Lipid panel  . Comprehensive metabolic panel  . Hemoglobin A1c  . EKG 12-Lead     Glenetta Hew, M.D., M.S. Interventional Cardiologist   Pager # [phone] Phone # [phone] [place], [place] [address]   Thank you for choosing Heart[facility] at [facility]!!

## 2019-05-08 NOTE — Patient Instructions (Signed)
Medication Instructions:  No changes *If you need a refill on your cardiac medications before your next appointment, please call your pharmacy*  Lab Work: Lipid cmp hgbA1c If you have labs (blood work) drawn today and your tests are completely normal, you will receive your results only by: [name] MyChart Message (if you have MyChart) OR . A paper copy in the mail If you have any lab test that is abnormal or we need to change your treatment, we will call you to review the results.  Testing/Procedures:  CT coronary calcium score. This test is done at [address]. This is $150 out of pocket.   Coronary CalciumScan A coronary calcium scan is an imaging test used to look for deposits of calcium and other fatty materials (plaques) in the inner lining of the blood vessels of the heart (coronary arteries). These deposits of calcium and plaques can partly clog and narrow the coronary arteries without producing any symptoms or warning signs. This puts a person at risk for a heart attack. This test can detect these deposits before symptoms develop. Tell a health care provider about:  Any allergies you have.  All medicines you are taking, including vitamins, herbs, eye drops, creams, and over-the-counter medicines.  Any problems you or family members have had with anesthetic medicines.  Any blood disorders you have.  Any surgeries you have had.  Any medical conditions you have.  Whether you are pregnant or may be pregnant. What are the risks? Generally, this is a safe procedure. However, problems may occur, including:  Harm to a pregnant woman and her unborn baby. This test involves the use of radiation. Radiation exposure can be dangerous to a pregnant woman and her unborn baby. If you are pregnant, you generally should not have this procedure done.  Slight increase in the risk of cancer. This is because of the radiation involved in the test. What happens before the  procedure? No preparation is needed for this procedure. What happens during the procedure?  You will undress and remove any jewelry around your neck or chest.  You will put on a hospital gown.  Sticky electrodes will be placed on your chest. The electrodes will be connected to an electrocardiogram (ECG) machine to record a tracing of the electrical activity of your heart.  A CT scanner will take pictures of your heart. During this time, you will be asked to lie still and hold your breath for 2-3 seconds while a picture of your heart is being taken. The procedure may vary among health care providers and hospitals. What happens after the procedure?  You can get dressed.  You can return to your normal activities.  It is up to you to get the results of your test. Ask your health care provider, or the department that is doing the test, when your results will be ready. Summary  A coronary calcium scan is an imaging test used to look for deposits of calcium and other fatty materials (plaques) in the inner lining of the blood vessels of the heart (coronary arteries).  Generally, this is a safe procedure. Tell your health care provider if you are pregnant or may be pregnant.  No preparation is needed for this procedure.  A CT scanner will take pictures of your heart.  You can return to your normal activities after the scan is done. This information is not intended to replace advice given to you by your health care provider. Make sure you discuss  any questions you have with your health care provider. Document Released: 09/10/2007 Document Revised: 02/01/2016 Document Reviewed: 02/01/2016 Elsevier Interactive Patient Education  2017 [employer].    Follow-Up: At [facility], you and your health needs are our priority.  As part of our continuing mission to provide you with exceptional heart care, we have created designated Provider Care Teams.  These Care Teams include your primary  Cardiologist (physician) and Advanced Practice Providers (APPs -  Physician Assistants and Nurse Practitioners) who all work together to provide you with the care you need, when you need it.  Your next appointment:   1 month(s)  The format for your next appointment:   In Person  Provider:   Bryan Lemma, MD  Other Instructions n/a

## 2019-05-10 ENCOUNTER — Encounter: Payer: Self-pay | Admitting: Cardiology

## 2019-05-10 NOTE — Assessment & Plan Note (Signed)
He is now about 4.5 years out from his pulmonary embolism that happened in the setting of being somewhat immobilized because of his abdominal issues.  He is still on Eliquis which is being managed by his PCP.  I am not aware of whether or not he had a hypercoagulable work-up done.  Will defer to PCP for now.

## 2019-05-10 NOTE — Assessment & Plan Note (Signed)
His blood pressure actually does not look too bad today.  I do agree that clonidine may not be the greatest option for treating his blood pressure. He apparently had some palpitations in the past and that is why is on Bystolic.  These seem to be pretty well controlled now. He is on Tekturna, but has yet not on diuretic.  For now I think we will just continue current meds but I would probably consider using clonidine for as needed purposes and potentially add a baseline diuretic.  Will readdress when we see him back in follow-up depending on what his blood pressure looks like.[name]

## 2019-05-10 NOTE — Assessment & Plan Note (Addendum)
74 year old gentleman with hypertension.  I am not aware of previous lipid evaluation.  He does not member the last time he had labs checked.   Obesity, hypertension 2 of 3 main components for the diagnosis of metabolic syndrome.  Would like to check lipid panel, A1c and CMP to determine if there is presence of any other risk factors.  Plan for now is to evaluate baseline cardiovascular risk with a coronary calcium score. We talked about pros and cons of a stress test, with him not having active anginal symptoms, I question the benefit of a screening stress test.  Coronary calcium score is probably more effective determine the extent of Hyperlipidemia. Otherwise would continue to try to manage blood pressure and lipids.  He will need lipids to be checked soon.  Plan: Check coronary calcium score as well as lipid panel chemistry panel and A1c to assess based on risk factors.  Would like to see how much the components of metabolic syndrome he qualifies for.

## 2019-05-10 NOTE — Assessment & Plan Note (Signed)
We talk about the importance of dietary modification and exercise to reduce risk factors.

## 2019-05-10 NOTE — Assessment & Plan Note (Signed)
Heart rate seems to be pretty well controlled on current dose of Bystolic.

## 2019-06-14 ENCOUNTER — Inpatient Hospital Stay: Admission: RE | Admit: 2019-06-14 | Payer: Medicare Other | Source: Ambulatory Visit

## 2019-06-19 LAB — COMPREHENSIVE METABOLIC PANEL
ALT: 16 IU/L (ref 0–44)
AST: 19 IU/L (ref 0–40)
Albumin/Globulin Ratio: 1.7 (ref 1.2–2.2)
Albumin: 4.2 g/dL (ref 3.7–4.7)
Alkaline Phosphatase: 71 IU/L (ref 39–117)
BUN/Creatinine Ratio: 16 (ref 10–24)
BUN: 15 mg/dL (ref 8–27)
Bilirubin Total: 0.3 mg/dL (ref 0.0–1.2)
CO2: 24 mmol/L (ref 20–29)
Calcium: 9.6 mg/dL (ref 8.6–10.2)
Chloride: 104 mmol/L (ref 96–106)
Creatinine, Ser: 0.96 mg/dL (ref 0.76–1.27)
GFR calc Af Amer: 92 mL/min/{1.73_m2} (ref >59)
GFR calc non Af Amer: 79 mL/min/{1.73_m2} (ref >59)
Globulin, Total: 2.5 g/dL (ref 1.5–4.5)
Glucose: 99 mg/dL (ref 65–99)
Potassium: 4.3 mmol/L (ref 3.5–5.2)
Sodium: 141 mmol/L (ref 134–144)
Total Protein: 6.7 g/dL (ref 6.0–8.5)

## 2019-06-19 LAB — LIPID PANEL
Chol/HDL Ratio: 2.1 ratio (ref 0.0–5.0)
Cholesterol, Total: 121 mg/dL (ref 100–199)
HDL: 59 mg/dL (ref >39)
LDL Chol Calc (NIH): 48 mg/dL (ref 0–99)
Triglycerides: 65 mg/dL (ref 0–149)
VLDL Cholesterol Cal: 14 mg/dL (ref 5–40)

## 2019-06-19 LAB — HEMOGLOBIN A1C
Est. average glucose Bld gHb Est-mCnc: 123 mg/dL
Hgb A1c MFr Bld: 5.9 % — ABNORMAL HIGH (ref 4.8–5.6)

## 2019-06-20 ENCOUNTER — Other Ambulatory Visit: Payer: Self-pay

## 2019-06-20 ENCOUNTER — Encounter: Payer: Self-pay | Admitting: Cardiology

## 2019-06-20 ENCOUNTER — Ambulatory Visit (INDEPENDENT_AMBULATORY_CARE_PROVIDER_SITE_OTHER): Payer: Medicare Other | Admitting: Cardiology

## 2019-06-20 VITALS — BP 142/82 | HR 80 | Ht 71.0 in | Wt 241.0 lb

## 2019-06-20 DIAGNOSIS — I2510 Atherosclerotic heart disease of native coronary artery without angina pectoris: Secondary | ICD-10-CM

## 2019-06-20 DIAGNOSIS — R000 Tachycardia, unspecified: Secondary | ICD-10-CM

## 2019-06-20 DIAGNOSIS — E669 Obesity, unspecified: Secondary | ICD-10-CM

## 2019-06-20 DIAGNOSIS — I10 Essential (primary) hypertension: Secondary | ICD-10-CM | POA: Diagnosis not present

## 2019-06-20 MED ORDER — ALISKIREN FUMARATE 150 MG PO TABS: 150.0000 mg | ORAL_TABLET | Freq: Every morning | ORAL | Status: DC

## 2019-06-20 MED ORDER — BYSTOLIC 20 MG PO TABS
1.0000 | ORAL_TABLET | Freq: Every day | ORAL | 6 refills | Status: AC
Start: 2019-06-20 — End: ?

## 2019-06-20 MED ORDER — CHLORTHALIDONE 25 MG PO TABS: 12.5000 mg | ORAL_TABLET | Freq: Every day | ORAL | 3 refills | Status: DC

## 2019-06-20 NOTE — Progress Notes (Signed)
Primary Care Provider: Leilani Able, MD [facility]) Cardiologist: No primary care provider on file. Electrophysiologist: None  Clinic Note: Chief Complaint  Patient presents with  . Follow-up    Doing well.  Blood pressures been better.    HPI:    Jesus [name] is a 74 y.o. African-American male with a history of hypertension, pulmonary embolism (on Eliquis) and obesity with OSA who is being seen today for baseline cardiology evaluation at the request of Leilani Able, MD.  [name] was just seen by me in February for evaluation of hypertension.  His blood pressure was actually pretty well controlled at that time.  He was just concerned about the medicines he was on.  We talked about baseline cardiovascular evaluation.  We did check a lipid panel and add talked about ordering a coronary calcium score.  He did not do the coronary calcium score test, but did get his lipids checked -> these levels look quite good, therefore I did not press the issue about scheduling the coronary calcium score CT.[name]  Recent Hospitalizations:  --> None  Reviewed  CV studies:    The following studies were reviewed today: (if available, images/films reviewed: From Epic Chart or Care Everywhere) . N/a -> patient opted not to do coronary CTA at this time.  Interval History:   [name] presents here today for follow-up after last visit.  He actually says that his pressures been pretty well controlled since I last saw him.  At home he says blood pressures usually 120/80 millimeters record range, and he has not had to take any clonidine.  He does have some lower extremity swelling however.  He denies any headaches or blurred vision.  No syncope or near syncope.   He has not had any rapid regular heartbeats palpitations.  No syncope/near syncope.  No chest pain or pressure with rest or exertion.  He may get little bit short of breath when he overdoes it because of deconditioning, but  otherwise doing well. He does not like taking clonidine because it makes him sleepy.  He has not taken clonidine since I just saw him  CV Review of Symptoms (Summary): no chest pain or dyspnea on exertion positive for - edema and Much more stable blood pressure still has some highs negative for - irregular heartbeat, loss of consciousness, orthopnea, palpitations, paroxysmal nocturnal dyspnea, rapid heart rate, shortness of breath or Syncope/near syncope, TIA/amaurosis fugax, claudication.  The patient DOES NOT have symptoms concerning for COVID-19 infection (fever, chills, cough, or new shortness of breath).  The patient is practicing social distancing & Masking.    REVIEWED OF SYSTEMS   A comprehensive ROS was performed. Review of Systems  Constitutional: Negative for malaise/fatigue and weight loss.  HENT: Negative for nosebleeds.   Eyes: Negative for blurred vision.  Respiratory: Negative for shortness of breath.   Cardiovascular: Positive for leg swelling.  Gastrointestinal: Negative for blood in stool and melena.  Genitourinary: Negative for hematuria.  Musculoskeletal: Positive for joint pain (Does have significant knee pain.  He is mostly limited by this discomfort as far as activity goes.  His orthopedic surgeon suggest that he may need knee surgery.). Negative for falls.  Neurological: Negative for dizziness, focal weakness and headaches.  Psychiatric/Behavioral: Negative for memory loss. The patient is not nervous/anxious.    I have reviewed and (if needed) personally updated the patient's problem list, medications, allergies, past medical and surgical history, social and family history.   PAST MEDICAL  HISTORY   Past Medical History:  Diagnosis Date  . Anxiety   . History of pulmonary embolism 09/17/2014  . Hydrocele, right   . Hypertension   . Spermatocele   . Urinary hesitancy     PAST SURGICAL HISTORY   Past Surgical History:  Procedure Laterality Date  .  HYDROCELE EXCISION  06/20/2011   Procedure: HYDROCELECTOMY ADULT;  Surgeon: Valetta Fuller, MD;  Location: [facility];  Service: Urology;  Laterality: Right;  . SCROTAL EXPLORATION  06/20/2011   Procedure: SCROTUM EXPLORATION;  Surgeon: Valetta Fuller, MD;  Location: [facility];  Service: Urology;  Laterality: Right;  . SPERMATOCELECTOMY  06/20/2011   Procedure: SPERMATOCELECTOMY;  Surgeon: Valetta Fuller, MD;  Location: [facility];  Service: Urology;  Laterality: Right;  . TRANSTHORACIC ECHOCARDIOGRAM  09/17/2014   Normal LV size and function.  EF 55 to 60%.  No R WMA.  RV not well seen.  [name] UMBILICAL HERNIA REPAIR  1998    MEDICATIONS/ALLERGIES   Current Meds  Medication Sig  . aliskiren (TEKTURNA) 150 MG tablet Take 1 tablet (150 mg total) by mouth every morning.  [name] apixaban (ELIQUIS) 5 MG TABS tablet Please take  2 tablets (10 mg) oral two times  daily for 6 days (until 09/26/2014), then take 1 tablet (5 mg) oral two times daily thereafter.  . Cholecalciferol (D3-1000 PO) Take 1 tablet by mouth daily.  . cloNIDine (CATAPRES) 0.2 MG tablet Take 0.2 mg by mouth daily.  [name] HYDROcodone-acetaminophen (NORCO) 10-325 MG tablet as needed.  [name] LORazepam (ATIVAN) 2 MG tablet Take 2 mg by mouth every 8 (eight) hours as needed for anxiety.  . Multiple Vitamin (MULTIVITAMIN WITH MINERALS) TABS tablet Take 1 tablet by mouth daily.  . Nebivolol HCl (BYSTOLIC) 20 MG TABS Take 1 tablet (20 mg total) by mouth at bedtime.  . Tamsulosin HCl (FLOMAX) 0.4 MG CAPS Take 0.4 mg by mouth daily.  . [DISCONTINUED] aliskiren (TEKTURNA) 150 MG tablet Take 150 mg by mouth daily.  . [DISCONTINUED] Nebivolol HCl (BYSTOLIC) 20 MG TABS Take 1 tablet by mouth daily.    No Known Allergies  SOCIAL HISTORY/FAMILY HISTORY   Social History   Tobacco Use  . Smoking status: Former Smoker    Types: Cigarettes    Quit date: 03/28/1958    Years since quitting: 61.2  . Smokeless  tobacco: Never Used  Substance Use Topics  . Alcohol use: Yes    Comment: 1-2 drinks per week  . Drug use: No   Social History   Social History Narrative   Rayburn is the husband of Dov Dill also a patient of Dr. Herbie Baltimore.   He is retired Hotel manager.    Family History family history includes Cancer in his father; Healthy in his brother, sister, and sister.  OBJCTIVE -PE, EKG, labs   Wt Readings from Last 3 Encounters:  06/20/19 241 lb (109.3 kg)  05/08/19 241 lb 12.8 oz (109.7 kg)  11/20/16 240 lb (108.9 kg)    Physical Exam: BP (!) 142/82   Pulse 80   Ht 5\' 11"  (1.803 m)   Wt 241 lb (109.3 kg)   SpO2 97%   BMI 33.61 kg/m  Physical Exam  Constitutional: He is oriented to person, place, and time. He appears well-developed and well-nourished. No distress.  Well-nourished.  Well-groomed.  Mild obese  HENT:  Head: Normocephalic and atraumatic.  Neck: No JVD present. Carotid bruit is not present.  Cardiovascular:  Normal rate, regular rhythm, normal heart sounds, intact distal pulses and normal pulses.  No extrasystoles are present. PMI is not displaced. Exam reveals no gallop and no friction rub.  No murmur heard. Pulmonary/Chest: Effort normal and breath sounds normal. No respiratory distress. He has no wheezes. He has no rales.  Musculoskeletal:        General: No edema. Normal range of motion.     Cervical back: Normal range of motion and neck supple.  Neurological: He is alert and oriented to person, place, and time.  Psychiatric: He has a normal mood and affect. His behavior is normal. Judgment and thought content normal.  Vitals reviewed.    Adult ECG Report Not checked  Recent Labs: No labs available Lab Results  Component Value Date   CHOL 121 06/18/2019   HDL 59 06/18/2019   LDLCALC 48 06/18/2019   TRIG 65 06/18/2019   CHOLHDL 2.1 06/18/2019   Lab Results  Component Value Date   CREATININE 0.96 06/18/2019   BUN 15 06/18/2019   NA 141 06/18/2019     K 4.3 06/18/2019   CL 104 06/18/2019   CO2 24 06/18/2019   No results found for: TSH  ASSESSMENT/PLAN    Problem List Items Addressed This Visit    Essential hypertension - Primary (Chronic)    Blood pressure looks pretty good and stable.  He is on pretty stable regimen, but I think we could probably bring it down further.  Would like to get it less than 130.  States he is having some swelling.  I will add a diuretic. Plan: Add chlorthalidone 12.5 mg daily.  He will need chemistry evaluation after starting.  He switch taking his Bystolic to the evening.  Take Tekturna and chlorthalidone together.      Relevant Medications   chlorthalidone (HYGROTON) 25 MG tablet   Nebivolol HCl (BYSTOLIC) 20 MG TABS   aliskiren (TEKTURNA) 150 MG tablet   Other Relevant Orders   Basic metabolic panel   Tachycardia (Chronic)    Well-controlled with beta-blocker.      Relevant Orders   Basic metabolic panel   Obesity (BMI 30.0-34.9) (Chronic)    The patient understands the need to lose weight with diet and exercise. We have discussed specific strategies for this.      Cardiovascular disease (Chronic)    When I last saw him, we had not had a lipid evaluation in a while.  His most recent labs show that his lipids are very well controlled with an LDL less than 50.  He is not on any medications for his lipids.  With such good control of lipids, and now better blood pressure control.  I think we can hold off on coronary calcium score.  This was simply just get a baseline evaluation and he is doing quite well.  If it comes to the point of him potentially needing surgery, I do not think he would need an ischemic evaluation and therefore would not want to cloud the issue.      Relevant Medications   chlorthalidone (HYGROTON) 25 MG tablet   Nebivolol HCl (BYSTOLIC) 20 MG TABS   aliskiren (TEKTURNA) 150 MG tablet   Other Relevant Orders   Basic metabolic panel       [id number] Education: The  signs and symptoms of COVID-19 were discussed with the patient and how to seek care for testing (follow up with PCP or arrange E-visit).   The importance of social distancing was discussed today.  I spent a total of 18 minutes with the patient. >  50% of the time was spent in direct patient consultation.  Additional time spent with chart review  / charting (studies, outside notes, etc): 10 Total Time: 28 min   Current medicines are reviewed at length with the patient today.  (+/- concerns) he is not sure about the different medications he is on.   Patient Instructions / Medication Changes & Studies & Tests Ordered   Patient Instructions  Medication Instructions:    START TAKING  CHLORTHALIDONE 12.5 G  IN THE MORNING WITH TEKTURNA  TAKE BYSTOLIC IN THE EVENING    *If you need a refill on your cardiac medications before your next appointment, please call your pharmacy*   Lab Work: BMP  - IN ONE WEEK     Testing/Procedures: WILL CANCEL CALCIUM SCORING   Follow-Up: At [facility], you and your health needs are our priority.  As part of our continuing mission to provide you with exceptional heart care, we have created designated Provider Care Teams.  These Care Teams include your primary Cardiologist (physician) and Advanced Practice Providers (APPs -  Physician Assistants and Nurse Practitioners) who all work together to provide you with the care you need, when you need it.    Your next appointment:   2 month(s)  The format for your next appointment:   Virtual Visit   Provider:   Bryan Lemma, MD   Other Instructions TAKE BLOOD PRESSURE  LOG  AND RECORD AND PRESENT AT NEXT APPOINTMENT WITH DR Herbie Baltimore    Studies Ordered:   Orders Placed This Encounter  Procedures  . Basic metabolic panel     Bryan Lemma, M.D., M.S. Interventional Cardiologist   Pager # [phone] Phone # [phone] [address] [place], [place] [address]   Thank you  for choosing Heartcare at [facility]!!

## 2019-06-20 NOTE — Patient Instructions (Addendum)
Medication Instructions:    START TAKING  CHLORTHALIDONE 12.5 G  IN THE MORNING WITH TEKTURNA  TAKE BYSTOLIC IN THE EVENING    *If you need a refill on your cardiac medications before your next appointment, please call your pharmacy*   Lab Work: BMP  - IN ONE WEEK     Testing/Procedures: WILL CANCEL CALCIUM SCORING   Follow-Up: At [facility], you and your health needs are our priority.  As part of our continuing mission to provide you with exceptional heart care, we have created designated Provider Care Teams.  These Care Teams include your primary Cardiologist (physician) and Advanced Practice Providers (APPs -  Physician Assistants and Nurse Practitioners) who all work together to provide you with the care you need, when you need it.    Your next appointment:   2 month(s)  The format for your next appointment:   Virtual Visit   Provider:   Bryan Lemma, MD   Other Instructions TAKE BLOOD PRESSURE  LOG  AND RECORD AND PRESENT AT NEXT APPOINTMENT WITH DR Herbie Baltimore

## 2019-06-23 ENCOUNTER — Encounter: Payer: Self-pay | Admitting: Cardiology

## 2019-06-23 NOTE — Assessment & Plan Note (Signed)
When I last saw him, we had not had a lipid evaluation in a while.  His most recent labs show that his lipids are very well controlled with an LDL less than 50.  He is not on any medications for his lipids.  With such good control of lipids, and now better blood pressure control.  I think we can hold off on coronary calcium score.  This was simply just get a baseline evaluation and he is doing quite well.  If it comes to the point of him potentially needing surgery, I do not think he would need an ischemic evaluation and therefore would not want to cloud the issue.

## 2019-06-23 NOTE — Assessment & Plan Note (Signed)
Well controlled with beta-blocker. 

## 2019-06-23 NOTE — Assessment & Plan Note (Signed)
The patient understands the need to lose weight with diet and exercise. We have discussed specific strategies for this.  

## 2019-06-23 NOTE — Assessment & Plan Note (Addendum)
Blood pressure looks pretty good and stable.  He is on pretty stable regimen, but I think we could probably bring it down further.  Would like to get it less than 130.  States he is having some swelling.  I will add a diuretic. Plan: Add chlorthalidone 12.5 mg daily.  He will need chemistry evaluation after starting.  He switch taking his Bystolic to the evening.  Take Tekturna and chlorthalidone together.

## 2019-06-28 ENCOUNTER — Telehealth: Payer: Self-pay | Admitting: Cardiology

## 2019-06-28 NOTE — Telephone Encounter (Signed)
  Pt c/o medication issue:  1. Name of Medication: chlorthalidone (HYGROTON) 25 MG tablet  2. How are you currently taking this medication (dosage and times per day)? Pt has not started taking this yet  3. Are you having a reaction (difficulty breathing--STAT)? no  4. What is your medication issue? Patient wanted to discuss why he needs to take this medication.  Please advise

## 2019-06-28 NOTE — Telephone Encounter (Signed)
Contacted patient- advised that he was suppose to start the Chlorthalidone, but he would like to not take it, because his blood pressure on the new medication is much better. He states his bp last night was 122/84, and 117/84. He states he feels fine, has no swelling, and does not want to take anything else at this time. Advised that Dr.Harding was on vacation, but would get back with [place] soon and we would let him know. Patient verbalized understanding.  Thankful for callback.

## 2019-07-01 NOTE — Telephone Encounter (Signed)
If that is what his blood pressures are running at home, then is fine to hold off on taking chlorthalidone.  Only use it for swelling.  Bryan Lemma, MD

## 2019-07-02 MED ORDER — CHLORTHALIDONE 25 MG PO TABS
12.5000 mg | ORAL_TABLET | Freq: Every day | ORAL | 3 refills | Status: AC | PRN
Start: 2019-07-02 — End: 2019-09-30

## 2019-07-02 NOTE — Telephone Encounter (Signed)
Called patient  - Information given to patient . Per Dr harding patient may hold medication unless swelling occurs . Patient states since  Separating  Tekturna and Bystolic- taking in the morning and one evening. Blood pressure has been in the 120's range.     patient asked if he needs to have BMP  Done  Since he did not start medication . RN informed patient no labs are not needed  But keep appointment  In May 2021 to see how he is doing,   medication list adjusted to show medication- chlorthalidone  has been changed to " As needed"

## 2019-08-13 ENCOUNTER — Telehealth: Payer: Medicare Other | Admitting: Cardiology

## 2019-08-27 ENCOUNTER — Telehealth: Payer: Medicare Other | Admitting: Cardiology

## 2020-05-21 ENCOUNTER — Other Ambulatory Visit: Payer: Self-pay | Admitting: Family Medicine

## 2020-05-21 DIAGNOSIS — Z8719 Personal history of other diseases of the digestive system: Secondary | ICD-10-CM

## 2020-05-21 DIAGNOSIS — R100 Acute abdomen: Secondary | ICD-10-CM

## 2020-05-21 DIAGNOSIS — R140 Abdominal distension (gaseous): Secondary | ICD-10-CM

## 2020-05-21 DIAGNOSIS — R1110 Vomiting, unspecified: Secondary | ICD-10-CM

## 2020-05-22 ENCOUNTER — Other Ambulatory Visit: Payer: Self-pay

## 2020-05-22 ENCOUNTER — Ambulatory Visit
Admission: RE | Admit: 2020-05-22 | Discharge: 2020-05-22 | Disposition: A | Discharge status: Home / Self Care | Payer: Medicare Other | Source: Ambulatory Visit | Attending: Family Medicine | Admitting: Family Medicine

## 2020-05-22 DIAGNOSIS — R100 Acute abdomen: Secondary | ICD-10-CM

## 2020-05-22 DIAGNOSIS — R1110 Vomiting, unspecified: Secondary | ICD-10-CM

## 2020-05-22 DIAGNOSIS — Z8719 Personal history of other diseases of the digestive system: Secondary | ICD-10-CM

## 2020-05-22 DIAGNOSIS — R140 Abdominal distension (gaseous): Secondary | ICD-10-CM

## 2020-05-23 ENCOUNTER — Emergency Department (HOSPITAL_COMMUNITY): Payer: Medicare Other | Admitting: Certified Registered"

## 2020-05-23 ENCOUNTER — Inpatient Hospital Stay (HOSPITAL_COMMUNITY)
Admission: EM | Admit: 2020-05-23 | Discharge: 2020-05-28 | DRG: 336 | Disposition: A | Discharge status: Home / Self Care | Payer: Medicare Other | Attending: Surgery | Admitting: Surgery

## 2020-05-23 ENCOUNTER — Encounter (HOSPITAL_COMMUNITY): Admission: EM | Disposition: A | Discharge status: Home / Self Care | Payer: Self-pay | Source: Home / Self Care

## 2020-05-23 ENCOUNTER — Encounter (HOSPITAL_COMMUNITY): Payer: Self-pay | Admitting: Emergency Medicine

## 2020-05-23 ENCOUNTER — Other Ambulatory Visit: Payer: Self-pay

## 2020-05-23 DIAGNOSIS — N179 Acute kidney failure, unspecified: Secondary | ICD-10-CM | POA: Diagnosis present

## 2020-05-23 DIAGNOSIS — K420 Umbilical hernia with obstruction, without gangrene: Secondary | ICD-10-CM | POA: Diagnosis present

## 2020-05-23 DIAGNOSIS — Z86711 Personal history of pulmonary embolism: Secondary | ICD-10-CM

## 2020-05-23 DIAGNOSIS — Z20822 Contact with and (suspected) exposure to covid-19: Secondary | ICD-10-CM | POA: Diagnosis present

## 2020-05-23 DIAGNOSIS — Z79899 Other long term (current) drug therapy: Secondary | ICD-10-CM

## 2020-05-23 DIAGNOSIS — Z87891 Personal history of nicotine dependence: Secondary | ICD-10-CM | POA: Diagnosis not present

## 2020-05-23 DIAGNOSIS — R188 Other ascites: Secondary | ICD-10-CM | POA: Diagnosis present

## 2020-05-23 DIAGNOSIS — Y838 Other surgical procedures as the cause of abnormal reaction of the patient, or of later complication, without mention of misadventure at the time of the procedure: Secondary | ICD-10-CM | POA: Diagnosis not present

## 2020-05-23 DIAGNOSIS — N433 Hydrocele, unspecified: Secondary | ICD-10-CM | POA: Diagnosis present

## 2020-05-23 DIAGNOSIS — E669 Obesity, unspecified: Secondary | ICD-10-CM | POA: Diagnosis present

## 2020-05-23 DIAGNOSIS — K56609 Unspecified intestinal obstruction, unspecified as to partial versus complete obstruction: Secondary | ICD-10-CM

## 2020-05-23 DIAGNOSIS — K567 Ileus, unspecified: Secondary | ICD-10-CM | POA: Diagnosis not present

## 2020-05-23 DIAGNOSIS — R140 Abdominal distension (gaseous): Secondary | ICD-10-CM | POA: Diagnosis present

## 2020-05-23 DIAGNOSIS — F419 Anxiety disorder, unspecified: Secondary | ICD-10-CM | POA: Diagnosis present

## 2020-05-23 DIAGNOSIS — Z6832 Body mass index (BMI) 32.0-32.9, adult: Secondary | ICD-10-CM

## 2020-05-23 DIAGNOSIS — K660 Peritoneal adhesions (postprocedural) (postinfection): Secondary | ICD-10-CM | POA: Diagnosis present

## 2020-05-23 DIAGNOSIS — I10 Essential (primary) hypertension: Secondary | ICD-10-CM | POA: Diagnosis present

## 2020-05-23 DIAGNOSIS — E876 Hypokalemia: Secondary | ICD-10-CM | POA: Diagnosis present

## 2020-05-23 DIAGNOSIS — L0390 Cellulitis, unspecified: Secondary | ICD-10-CM | POA: Diagnosis present

## 2020-05-23 DIAGNOSIS — Z7901 Long term (current) use of anticoagulants: Secondary | ICD-10-CM | POA: Diagnosis not present

## 2020-05-23 DIAGNOSIS — K430 Incisional hernia with obstruction, without gangrene: Principal | ICD-10-CM | POA: Diagnosis present

## 2020-05-23 DIAGNOSIS — L7632 Postprocedural hematoma of skin and subcutaneous tissue following other procedure: Secondary | ICD-10-CM | POA: Diagnosis not present

## 2020-05-23 HISTORY — PX: LAPAROTOMY: SHX154

## 2020-05-23 LAB — COMPREHENSIVE METABOLIC PANEL
ALT: 24 U/L (ref 0–44)
AST: 38 U/L (ref 15–41)
Albumin: 4.5 g/dL (ref 3.5–5.0)
Alkaline Phosphatase: 51 U/L (ref 38–126)
Anion gap: 16 — ABNORMAL HIGH (ref 5–15)
BUN: 31 mg/dL — ABNORMAL HIGH (ref 8–23)
CO2: 27 mmol/L (ref 22–32)
Calcium: 11.1 mg/dL — ABNORMAL HIGH (ref 8.9–10.3)
Chloride: 96 mmol/L — ABNORMAL LOW (ref 98–111)
Creatinine, Ser: 1.2 mg/dL (ref 0.61–1.24)
GFR, Estimated: >60 mL/min (ref >60)
Glucose, Bld: 118 mg/dL — ABNORMAL HIGH (ref 70–99)
Potassium: 3.2 mmol/L — ABNORMAL LOW (ref 3.5–5.1)
Sodium: 139 mmol/L (ref 135–145)
Total Bilirubin: 1.9 mg/dL — ABNORMAL HIGH (ref 0.3–1.2)
Total Protein: 7.8 g/dL (ref 6.5–8.1)

## 2020-05-23 LAB — PROTIME-INR
INR: 1.3 — ABNORMAL HIGH (ref 0.8–1.2)
Prothrombin Time: 15.3 seconds — ABNORMAL HIGH (ref 11.4–15.2)

## 2020-05-23 LAB — TYPE AND SCREEN
ABO/RH(D): A POS
Antibody Screen: NEGATIVE

## 2020-05-23 LAB — LIPASE, BLOOD: Lipase: 30 U/L (ref 11–51)

## 2020-05-23 LAB — CBC
HCT: 51 % (ref 39.0–52.0)
Hemoglobin: 17.1 g/dL — ABNORMAL HIGH (ref 13.0–17.0)
MCH: 28.9 pg (ref 26.0–34.0)
MCHC: 33.5 g/dL (ref 30.0–36.0)
MCV: 86.1 fL (ref 80.0–100.0)
Platelets: 188 10*3/uL (ref 150–400)
RBC: 5.92 MIL/uL — ABNORMAL HIGH (ref 4.22–5.81)
RDW: 13.5 % (ref 11.5–15.5)
WBC: 9 10*3/uL (ref 4.0–10.5)
nRBC: 0 % (ref 0.0–0.2)

## 2020-05-23 LAB — RESP PANEL BY RT-PCR (FLU A&B, COVID) ARPGX2
Influenza A by PCR: NEGATIVE
Influenza B by PCR: NEGATIVE
SARS Coronavirus 2 by RT PCR: NEGATIVE

## 2020-05-23 LAB — APTT: aPTT: 33 seconds (ref 24–36)

## 2020-05-23 SURGERY — LAPAROTOMY, EXPLORATORY
Anesthesia: General | Site: Abdomen

## 2020-05-23 MED ORDER — DIPHENHYDRAMINE HCL 50 MG/ML IJ SOLN: 12.5000 mg | Freq: Four times a day (QID) | INTRAMUSCULAR | Status: DC | PRN

## 2020-05-23 MED ORDER — ACETAMINOPHEN 650 MG RE SUPP: 650.0000 mg | Freq: Four times a day (QID) | RECTAL | Status: DC | PRN

## 2020-05-23 MED ORDER — ONDANSETRON HCL 4 MG/2ML IJ SOLN
INTRAMUSCULAR | Status: DC | PRN
  Administered 2020-05-23: 4 mg via INTRAVENOUS

## 2020-05-23 MED ORDER — EPHEDRINE SULFATE 50 MG/ML IJ SOLN
INTRAMUSCULAR | Status: DC | PRN
  Administered 2020-05-23: 10 mg via INTRAVENOUS

## 2020-05-23 MED ORDER — NEBIVOLOL HCL 10 MG PO TABS
20.0000 mg | ORAL_TABLET | Freq: Every day | ORAL | Status: DC
  Administered 2020-05-24 – 2020-05-28 (×4): 20 mg via ORAL
  Filled 2020-05-23 (×5): qty 2

## 2020-05-23 MED ORDER — ONDANSETRON 4 MG PO TBDP: 4.0000 mg | ORAL_TABLET | Freq: Four times a day (QID) | ORAL | Status: DC | PRN

## 2020-05-23 MED ORDER — HYDROMORPHONE HCL 1 MG/ML IJ SOLN
0.5000 mg | Freq: Once | INTRAMUSCULAR | Status: AC
  Administered 2020-05-23: 0.5 mg via INTRAVENOUS
  Filled 2020-05-23: qty 1

## 2020-05-23 MED ORDER — METOPROLOL TARTRATE 5 MG/5ML IV SOLN: 5.0000 mg | Freq: Four times a day (QID) | INTRAVENOUS | Status: DC | PRN

## 2020-05-23 MED ORDER — SODIUM CHLORIDE 0.9 % IV BOLUS
1000.0000 mL | Freq: Once | INTRAVENOUS | Status: AC
  Administered 2020-05-23: 1000 mL via INTRAVENOUS

## 2020-05-23 MED ORDER — FENTANYL CITRATE (PF) 250 MCG/5ML IJ SOLN
INTRAMUSCULAR | Status: AC
  Filled 2020-05-23: qty 5

## 2020-05-23 MED ORDER — ONDANSETRON HCL 4 MG/2ML IJ SOLN
4.0000 mg | Freq: Four times a day (QID) | INTRAMUSCULAR | Status: DC | PRN
  Administered 2020-05-24 – 2020-05-25 (×4): 4 mg via INTRAVENOUS
  Filled 2020-05-23 (×4): qty 2

## 2020-05-23 MED ORDER — MIDAZOLAM HCL 2 MG/2ML IJ SOLN
INTRAMUSCULAR | Status: AC
  Filled 2020-05-23: qty 2

## 2020-05-23 MED ORDER — DEXAMETHASONE SODIUM PHOSPHATE 10 MG/ML IJ SOLN
INTRAMUSCULAR | Status: DC | PRN
  Administered 2020-05-23: 5 mg via INTRAVENOUS

## 2020-05-23 MED ORDER — PROCHLORPERAZINE MALEATE 10 MG PO TABS
10.0000 mg | ORAL_TABLET | Freq: Four times a day (QID) | ORAL | Status: DC | PRN
  Filled 2020-05-23: qty 1

## 2020-05-23 MED ORDER — FENTANYL CITRATE (PF) 250 MCG/5ML IJ SOLN
INTRAMUSCULAR | Status: DC | PRN
  Administered 2020-05-23: 25 ug via INTRAVENOUS
  Administered 2020-05-23: 100 ug via INTRAVENOUS
  Administered 2020-05-23: 50 ug via INTRAVENOUS
  Administered 2020-05-23: 25 ug via INTRAVENOUS
  Administered 2020-05-23: 50 ug via INTRAVENOUS

## 2020-05-23 MED ORDER — FENTANYL CITRATE (PF) 100 MCG/2ML IJ SOLN
INTRAMUSCULAR | Status: AC
  Filled 2020-05-23: qty 2

## 2020-05-23 MED ORDER — ALBUMIN HUMAN 5 % IV SOLN: INTRAVENOUS | Status: DC | PRN

## 2020-05-23 MED ORDER — MIDAZOLAM HCL 5 MG/5ML IJ SOLN
INTRAMUSCULAR | Status: DC | PRN
  Administered 2020-05-23: 1 mg via INTRAVENOUS

## 2020-05-23 MED ORDER — ONDANSETRON HCL 4 MG/2ML IJ SOLN: 4.0000 mg | Freq: Once | INTRAMUSCULAR | Status: DC | PRN

## 2020-05-23 MED ORDER — ACETAMINOPHEN 10 MG/ML IV SOLN
INTRAVENOUS | Status: AC
  Filled 2020-05-23: qty 100

## 2020-05-23 MED ORDER — SODIUM CHLORIDE 0.9 % IV SOLN: INTRAVENOUS | Status: DC

## 2020-05-23 MED ORDER — GABAPENTIN 100 MG PO CAPS
100.0000 mg | ORAL_CAPSULE | Freq: Two times a day (BID) | ORAL | Status: DC
  Filled 2020-05-23 (×2): qty 1

## 2020-05-23 MED ORDER — TRAMADOL HCL 50 MG PO TABS: 50.0000 mg | ORAL_TABLET | Freq: Four times a day (QID) | ORAL | Status: DC | PRN

## 2020-05-23 MED ORDER — OXYCODONE HCL 5 MG PO TABS: 5.0000 mg | ORAL_TABLET | ORAL | Status: DC | PRN

## 2020-05-23 MED ORDER — VASOPRESSIN 20 UNIT/ML IV SOLN
INTRAVENOUS | Status: DC | PRN
  Administered 2020-05-23: 1 [IU] via INTRAVENOUS
  Administered 2020-05-23: 2 [IU] via INTRAVENOUS

## 2020-05-23 MED ORDER — OXYMETAZOLINE HCL 0.05 % NA SOLN
NASAL | Status: AC
  Filled 2020-05-23: qty 30

## 2020-05-23 MED ORDER — ACETAMINOPHEN 10 MG/ML IV SOLN
1000.0000 mg | Freq: Once | INTRAVENOUS | Status: DC | PRN
  Administered 2020-05-23: 1000 mg via INTRAVENOUS

## 2020-05-23 MED ORDER — SODIUM CHLORIDE 0.9 % IV SOLN
2.0000 g | INTRAVENOUS | Status: AC
  Administered 2020-05-23: 2 g via INTRAVENOUS
  Filled 2020-05-23 (×2): qty 2

## 2020-05-23 MED ORDER — DIPHENHYDRAMINE HCL 12.5 MG/5ML PO ELIX: 12.5000 mg | ORAL_SOLUTION | Freq: Four times a day (QID) | ORAL | Status: DC | PRN

## 2020-05-23 MED ORDER — LIDOCAINE 2% (20 MG/ML) 5 ML SYRINGE
INTRAMUSCULAR | Status: DC | PRN
  Administered 2020-05-23: 60 mg via INTRAVENOUS

## 2020-05-23 MED ORDER — ONDANSETRON HCL 4 MG/2ML IJ SOLN
4.0000 mg | Freq: Once | INTRAMUSCULAR | Status: AC
  Administered 2020-05-23: 4 mg via INTRAVENOUS
  Filled 2020-05-23: qty 2

## 2020-05-23 MED ORDER — KETOROLAC TROMETHAMINE 15 MG/ML IJ SOLN: 15.0000 mg | Freq: Once | INTRAMUSCULAR | Status: DC | PRN

## 2020-05-23 MED ORDER — PROPOFOL 10 MG/ML IV BOLUS
INTRAVENOUS | Status: DC | PRN
  Administered 2020-05-23: 150 mg via INTRAVENOUS

## 2020-05-23 MED ORDER — PHENYLEPHRINE HCL-NACL 10-0.9 MG/250ML-% IV SOLN
INTRAVENOUS | Status: DC | PRN
  Administered 2020-05-23: 50 ug/min via INTRAVENOUS

## 2020-05-23 MED ORDER — PROCHLORPERAZINE EDISYLATE 10 MG/2ML IJ SOLN
5.0000 mg | Freq: Four times a day (QID) | INTRAMUSCULAR | Status: DC | PRN
  Administered 2020-05-25: 10 mg via INTRAVENOUS
  Filled 2020-05-23: qty 2

## 2020-05-23 MED ORDER — SUGAMMADEX SODIUM 200 MG/2ML IV SOLN
INTRAVENOUS | Status: DC | PRN
  Administered 2020-05-23: 200 mg via INTRAVENOUS

## 2020-05-23 MED ORDER — PHENYLEPHRINE 40 MCG/ML (10ML) SYRINGE FOR IV PUSH (FOR BLOOD PRESSURE SUPPORT)
PREFILLED_SYRINGE | INTRAVENOUS | Status: DC | PRN
  Administered 2020-05-23 (×2): 200 ug via INTRAVENOUS

## 2020-05-23 MED ORDER — ROCURONIUM BROMIDE 10 MG/ML (PF) SYRINGE
PREFILLED_SYRINGE | INTRAVENOUS | Status: DC | PRN
  Administered 2020-05-23: 10 mg via INTRAVENOUS
  Administered 2020-05-23: 60 mg via INTRAVENOUS
  Administered 2020-05-23: 10 mg via INTRAVENOUS

## 2020-05-23 MED ORDER — METHOCARBAMOL 1000 MG/10ML IJ SOLN
500.0000 mg | Freq: Three times a day (TID) | INTRAVENOUS | Status: DC | PRN
  Filled 2020-05-23: qty 5

## 2020-05-23 MED ORDER — KCL-LACTATED RINGERS-D5W 20 MEQ/L IV SOLN
INTRAVENOUS | Status: DC
  Filled 2020-05-23 (×2): qty 1000

## 2020-05-23 MED ORDER — VASOPRESSIN 20 UNIT/ML IV SOLN
INTRAVENOUS | Status: AC
  Filled 2020-05-23: qty 1

## 2020-05-23 MED ORDER — 0.9 % SODIUM CHLORIDE (POUR BTL) OPTIME
TOPICAL | Status: DC | PRN
  Administered 2020-05-23: 1000 mL

## 2020-05-23 MED ORDER — TAMSULOSIN HCL 0.4 MG PO CAPS
0.4000 mg | ORAL_CAPSULE | Freq: Every day | ORAL | Status: DC
  Administered 2020-05-24 – 2020-05-28 (×4): 0.4 mg via ORAL
  Filled 2020-05-23 (×4): qty 1

## 2020-05-23 MED ORDER — SUCCINYLCHOLINE CHLORIDE 200 MG/10ML IV SOSY
PREFILLED_SYRINGE | INTRAVENOUS | Status: DC | PRN
  Administered 2020-05-23: 100 mg via INTRAVENOUS

## 2020-05-23 MED ORDER — LACTATED RINGERS IV SOLN: INTRAVENOUS | Status: DC | PRN

## 2020-05-23 MED ORDER — PANTOPRAZOLE SODIUM 40 MG IV SOLR
40.0000 mg | Freq: Every day | INTRAVENOUS | Status: DC
  Administered 2020-05-23 – 2020-05-26 (×4): 40 mg via INTRAVENOUS
  Filled 2020-05-23 (×4): qty 40

## 2020-05-23 MED ORDER — LORAZEPAM 1 MG PO TABS: 2.0000 mg | ORAL_TABLET | Freq: Every evening | ORAL | Status: DC | PRN

## 2020-05-23 MED ORDER — HYDROMORPHONE HCL 1 MG/ML IJ SOLN
0.5000 mg | INTRAMUSCULAR | Status: DC | PRN
  Administered 2020-05-23 – 2020-05-24 (×5): 1 mg via INTRAVENOUS
  Filled 2020-05-23 (×5): qty 1

## 2020-05-23 MED ORDER — PROPOFOL 10 MG/ML IV BOLUS
INTRAVENOUS | Status: AC
  Filled 2020-05-23: qty 20

## 2020-05-23 MED ORDER — FENTANYL CITRATE (PF) 100 MCG/2ML IJ SOLN
25.0000 ug | INTRAMUSCULAR | Status: DC | PRN
  Administered 2020-05-23 (×2): 50 ug via INTRAVENOUS

## 2020-05-23 MED ORDER — ALBUTEROL SULFATE HFA 108 (90 BASE) MCG/ACT IN AERS
1.0000 | INHALATION_SPRAY | Freq: Four times a day (QID) | RESPIRATORY_TRACT | Status: DC | PRN
  Filled 2020-05-23: qty 6.7

## 2020-05-23 MED ORDER — ENOXAPARIN SODIUM 40 MG/0.4ML ~~LOC~~ SOLN
40.0000 mg | SUBCUTANEOUS | Status: DC
  Administered 2020-05-24 – 2020-05-25 (×2): 40 mg via SUBCUTANEOUS
  Filled 2020-05-23 (×2): qty 0.4

## 2020-05-23 MED ORDER — ACETAMINOPHEN 325 MG PO TABS: 650.0000 mg | ORAL_TABLET | Freq: Four times a day (QID) | ORAL | Status: DC | PRN

## 2020-05-23 SURGICAL SUPPLY — 45 items
BINDER ABDOMINAL 12 ML 46-62 (SOFTGOODS) ×2 IMPLANT
BNDG GAUZE ELAST 4 BULKY (GAUZE/BANDAGES/DRESSINGS) ×2 IMPLANT
CANISTER SUCT 3000ML PPV (MISCELLANEOUS) ×2 IMPLANT
CHLORAPREP W/TINT 26 (MISCELLANEOUS) ×2 IMPLANT
COVER SURGICAL LIGHT HANDLE (MISCELLANEOUS) ×2 IMPLANT
DRAPE LAPAROSCOPIC ABDOMINAL (DRAPES) ×2 IMPLANT
DRAPE WARM FLUID 44X44 (DRAPES) ×2 IMPLANT
DRSG OPSITE POSTOP 4X10 (GAUZE/BANDAGES/DRESSINGS) IMPLANT
DRSG OPSITE POSTOP 4X8 (GAUZE/BANDAGES/DRESSINGS) ×2 IMPLANT
ELECT BLADE 6.5 EXT (BLADE) ×2 IMPLANT
ELECT CAUTERY BLADE 6.4 (BLADE) ×2 IMPLANT
ELECT REM PT RETURN 9FT ADLT (ELECTROSURGICAL) ×2
ELECTRODE REM PT RTRN 9FT ADLT (ELECTROSURGICAL) ×1 IMPLANT
GLOVE BIO SURGEON STRL SZ 6 (GLOVE) ×4 IMPLANT
GLOVE SURG ENC MOIS LTX SZ6 (GLOVE) ×2 IMPLANT
GLOVE SURG UNDER LTX SZ6.5 (GLOVE) ×2 IMPLANT
GOWN STRL REUS W/ TWL LRG LVL3 (GOWN DISPOSABLE) ×2 IMPLANT
GOWN STRL REUS W/TWL LRG LVL3 (GOWN DISPOSABLE) ×2
HANDLE SUCTION POOLE (INSTRUMENTS) ×1 IMPLANT
KIT BASIN OR (CUSTOM PROCEDURE TRAY) ×2 IMPLANT
KIT TURNOVER KIT B (KITS) ×2 IMPLANT
LIGASURE IMPACT 36 18CM CVD LR (INSTRUMENTS) ×2 IMPLANT
NS IRRIG 1000ML POUR BTL (IV SOLUTION) ×4 IMPLANT
PACK GENERAL/GYN (CUSTOM PROCEDURE TRAY) ×2 IMPLANT
PAD ARMBOARD 7.5X6 YLW CONV (MISCELLANEOUS) ×2 IMPLANT
PENCIL SMOKE EVACUATOR (MISCELLANEOUS) ×2 IMPLANT
RELOAD PROXIMATE 75MM BLUE (ENDOMECHANICALS) ×6 IMPLANT
SPECIMEN JAR LARGE (MISCELLANEOUS) IMPLANT
SPONGE LAP 18X18 RF (DISPOSABLE) ×4 IMPLANT
STAPLER GUN LINEAR PROX 60 (STAPLE) ×2 IMPLANT
STAPLER PROXIMATE 75MM BLUE (STAPLE) ×2 IMPLANT
STAPLER VISISTAT 35W (STAPLE) ×2 IMPLANT
SUCTION POOLE HANDLE (INSTRUMENTS) ×2
SUT NOVA NAB DX-16 0-1 5-0 T12 (SUTURE) ×2 IMPLANT
SUT PDS AB 1 TP1 96 (SUTURE) ×4 IMPLANT
SUT SILK 2 0 SH CR/8 (SUTURE) ×2 IMPLANT
SUT SILK 2 0 TIES 10X30 (SUTURE) ×2 IMPLANT
SUT SILK 3 0 SH CR/8 (SUTURE) ×2 IMPLANT
SUT SILK 3 0 TIES 10X30 (SUTURE) ×2 IMPLANT
SUT VIC AB 3-0 SH 18 (SUTURE) ×2 IMPLANT
SUT VIC AB 3-0 SH 27 (SUTURE) ×2
SUT VIC AB 3-0 SH 27X BRD (SUTURE) ×2 IMPLANT
TAPE CLOTH SURG 6X10 WHT LF (GAUZE/BANDAGES/DRESSINGS) ×2 IMPLANT
TOWEL GREEN STERILE (TOWEL DISPOSABLE) ×2 IMPLANT
TRAY FOLEY MTR SLVR 16FR STAT (SET/KITS/TRAYS/PACK) IMPLANT

## 2020-05-23 NOTE — Anesthesia Postprocedure Evaluation (Signed)
Anesthesia Post Note  Patient: Jesus [name]  Procedure(s) Performed: EXPLORATORY LAPAROTOMY, LYSIS OF ADHESIONS, PRIMARY REPAIR AND REVISION RECURRENT VENTRAL HERNIA (N/A Abdomen)     Patient location during evaluation: PACU Anesthesia Type: General Level of consciousness: awake Pain management: pain level controlled Vital Signs Assessment: post-procedure vital signs reviewed and stable Respiratory status: spontaneous breathing, nonlabored ventilation, respiratory function stable and patient connected to nasal cannula oxygen Cardiovascular status: blood pressure returned to baseline and stable Postop Assessment: no apparent nausea or vomiting Anesthetic complications: no   No complications documented.  Last Vitals:  Vitals:   05/23/20 2205 05/23/20 2220  BP: 135/76 (!) 142/78  Pulse: 98 (!) 102  Resp: (!) 22 18  Temp:  36.8 C  SpO2: 100% 98%    Last Pain:  Vitals:   05/23/20 2220  TempSrc:   PainSc: 8                  Ryan P Ellender

## 2020-05-23 NOTE — H&P (Signed)
Jesus [name] is an 75 y.o. male.   Chief Complaint: bowel obstruction HPI:  Pt is a 75 yo M who came to the ED today following an outpatient CT yesterday showing an incarcerated hernia with SBO.  The patient had a hernia repair around 20 years ago in which he states that no mesh was placed.  He has subsequently developed a recurrent hernia that has always easily reduced.  He normally doesn't have any issues from this.  However 3 days ago, he developed a bulge that he could not get to reduce. This was accompanied by pain, nausea, and vomiting.  He hasn't passed gas in several days.  He denies fever/chills.  He states that he is uncomfortable, but not in severe pain.  He hasn't had relief laying on his side or over the counter pain meds.    Of note, he is on eliquis for h/o PE in 2016.  He hasn't taken it today due to n/v.  He also has significant HTN.  Past Medical History:  Diagnosis Date  . Anxiety   . History of pulmonary embolism 09/17/2014  . Hydrocele, right   . Hypertension   . Spermatocele   . Urinary hesitancy     Past Surgical History:  Procedure Laterality Date  . HYDROCELE EXCISION  06/20/2011   Procedure: HYDROCELECTOMY ADULT;  Surgeon: Valetta Fuller, MD;  Location: [facility];  Service: Urology;  Laterality: Right;  . SCROTAL EXPLORATION  06/20/2011   Procedure: SCROTUM EXPLORATION;  Surgeon: Valetta Fuller, MD;  Location: [facility];  Service: Urology;  Laterality: Right;  . SPERMATOCELECTOMY  06/20/2011   Procedure: SPERMATOCELECTOMY;  Surgeon: Valetta Fuller, MD;  Location: [facility];  Service: Urology;  Laterality: Right;  . TRANSTHORACIC ECHOCARDIOGRAM  09/17/2014   Normal LV size and function.  EF 55 to 60%.  No R WMA.  RV not well seen.  [name] UMBILICAL HERNIA REPAIR  1998    Family History  Problem Relation Age of Onset  . Cancer Father   . Healthy Sister   . Healthy Brother   . Healthy Sister    Social History:   reports that he quit smoking about 62 years ago. His smoking use included cigarettes. He has never used smokeless tobacco. He reports current alcohol use. He reports that he does not use drugs.  Allergies: No Known Allergies  Current Meds  Medication Sig  . albuterol (VENTOLIN HFA) 108 (90 Base) MCG/ACT inhaler Inhale 1-2 puffs into the lungs as needed for wheezing or shortness of breath.  [name] aliskiren (TEKTURNA) 150 MG tablet Take 1 tablet (150 mg total) by mouth every morning.  [name] apixaban (ELIQUIS) 5 MG TABS tablet Please take  2 tablets (10 mg) oral two times  daily for 6 days (until 09/26/2014), then take 1 tablet (5 mg) oral two times daily thereafter.  . Cholecalciferol (D3-1000 PO) Take 1 tablet by mouth daily.  . cloNIDine (CATAPRES) 0.2 MG tablet Take 0.1 mg by mouth as needed (blood pressure).  . LORazepam (ATIVAN) 2 MG tablet Take 2 mg by mouth at bedtime.  . Multiple Vitamin (MULTIVITAMIN WITH MINERALS) TABS tablet Take 1 tablet by mouth daily.  . Nebivolol HCl (BYSTOLIC) 20 MG TABS Take 1 tablet (20 mg total) by mouth at bedtime. (Patient taking differently: Take 1 tablet by mouth daily.)  . Tamsulosin HCl (FLOMAX) 0.4 MG CAPS Take 0.4 mg by mouth daily.     Results for orders placed  or performed during the hospital encounter of 05/23/20 (from the past 48 hour(s))  Lipase, blood     Status: None   Collection Time: 05/23/20 11:16 AM  Result Value Ref Range   Lipase 30 11 - 51 U/L    Comment: Performed at [facility], [address], [place], [place] [address]  Comprehensive metabolic panel     Status: Abnormal   Collection Time: 05/23/20 11:16 AM  Result Value Ref Range   Sodium 139 135 - 145 mmol/L   Potassium 3.2 (L) 3.5 - 5.1 mmol/L   Chloride 96 (L) 98 - 111 mmol/L   CO2 27 22 - 32 mmol/L   Glucose, Bld 118 (H) 70 - 99 mg/dL    Comment: Glucose reference range applies only to samples taken after fasting for at least 8 hours.   BUN 31 (H) 8 - 23 mg/dL    Creatinine, Ser 5.64 0.61 - 1.24 mg/dL   Calcium 33.2 (H) 8.9 - 10.3 mg/dL   Total Protein 7.8 6.5 - 8.1 g/dL   Albumin 4.5 3.5 - 5.0 g/dL   AST 38 15 - 41 U/L   ALT 24 0 - 44 U/L   Alkaline Phosphatase 51 38 - 126 U/L   Total Bilirubin 1.9 (H) 0.3 - 1.2 mg/dL   GFR, Estimated >95 >18 mL/min    Comment: (NOTE) Calculated using the CKD-EPI Creatinine Equation (2021)    Anion gap 16 (H) 5 - 15    Comment: Performed at [facility], [address], [place], [place] [address]  CBC     Status: Abnormal   Collection Time: 05/23/20 11:16 AM  Result Value Ref Range   WBC 9.0 4.0 - 10.5 K/uL   RBC 5.92 (H) 4.22 - 5.81 MIL/uL   Hemoglobin 17.1 (H) 13.0 - 17.0 g/dL   HCT 06.3 01.6 - 01.0 %   MCV 86.1 80.0 - 100.0 fL   MCH 28.9 26.0 - 34.0 pg   MCHC 33.5 30.0 - 36.0 g/dL   RDW 93.2 35.5 - 73.2 %   Platelets 188 150 - 400 K/uL   nRBC 0.0 0.0 - 0.2 %    Comment: Performed at [facility], [address] [place], [place] [address]  Resp Panel by RT-PCR (Flu A&B, Covid) Nasopharyngeal Swab     Status: None   Collection Time: 05/23/20 11:32 AM   Specimen: Nasopharyngeal Swab; Nasopharyngeal(NP) swabs in vial transport medium  Result Value Ref Range   SARS Coronavirus 2 by RT PCR NEGATIVE NEGATIVE    Comment: (NOTE) SARS-CoV-2 target nucleic acids are NOT DETECTED.  The SARS-CoV-2 RNA is generally detectable in upper respiratory specimens during the acute phase of infection. The lowest concentration of SARS-CoV-2 viral copies this assay can detect is 138 copies/mL. A negative result does not preclude SARS-Cov-2 infection and should not be used as the sole basis for treatment or other patient management decisions. A negative result may occur with  improper specimen collection/handling, submission of specimen other than nasopharyngeal swab, presence of viral mutation(s) within the areas targeted by this assay, and inadequate number of viral copies(<138 copies/mL). A negative  result must be combined with clinical observations, patient history, and epidemiological information. The expected result is Negative.  Fact Sheet for Patients:  BloggerCourse.com  Fact Sheet for Healthcare Providers:  SeriousBroker.it  This test is no t yet approved or cleared by the [place] FDA and  has been authorized for detection and/or diagnosis of  SARS-CoV-2 by FDA under an Emergency Use Authorization (EUA). This EUA will remain  in effect (meaning this test can be used) for the duration of the COVID-19 declaration under Section 564(b)(1) of the Act, 21 U.S.C.section 360bbb-3(b)(1), unless the authorization is terminated  or revoked sooner.       Influenza A by PCR NEGATIVE NEGATIVE   Influenza B by PCR NEGATIVE NEGATIVE    Comment: (NOTE) The Xpert Xpress SARS-CoV-2/FLU/RSV plus assay is intended as an aid in the diagnosis of influenza from Nasopharyngeal swab specimens and should not be used as a sole basis for treatment. Nasal washings and aspirates are unacceptable for Xpert Xpress SARS-CoV-2/FLU/RSV testing.  Fact Sheet for Patients: BloggerCourse.com  Fact Sheet for Healthcare Providers: SeriousBroker.it  This test is not yet approved or cleared by the [place] FDA and has been authorized for detection and/or diagnosis of SARS-CoV-2 by FDA under an Emergency Use Authorization (EUA). This EUA will remain in effect (meaning this test can be used) for the duration of the COVID-19 declaration under Section 564(b)(1) of the Act, 21 U.S.C. section 360bbb-3(b)(1), unless the authorization is terminated or revoked.  Performed at [facility], [address], [place], [place] [address]   APTT     Status: None   Collection Time: 05/23/20 11:49 AM  Result Value Ref Range   aPTT 33 24 - 36 seconds    Comment: Performed at [facility], [address], [place], [place] [address]  Protime-INR     Status: Abnormal   Collection Time: 05/23/20 11:49 AM  Result Value Ref Range   Prothrombin Time 15.3 (H) 11.4 - 15.2 seconds   INR 1.3 (H) 0.8 - 1.2    Comment: (NOTE) INR goal varies based on device and disease states. Performed at [facility], [address], [place], [place] [address]   Type and screen     Status: None   Collection Time: 05/23/20 11:50 AM  Result Value Ref Range   ABO/RH(D) A POS    Antibody Screen NEG    Sample Expiration      05/26/2020,2359 Performed at [facility], [address] [place], [place] [address]    CT ABDOMEN WO CONTRAST  Addendum Date: 05/22/2020   ADDENDUM REPORT: 05/22/2020 16:09 ADDENDUM: Findings discussed with patient by phone on 05/22/2020 at 1605 hours. Patient indicated that he had already spoken with his physician concerning the exam. Electronically Signed   By: Ulyses Southward M.D.   On: 05/22/2020 16:09   Result Date: 05/22/2020 CLINICAL DATA:  Nausea and constipation for few days, history of hernia repair EXAM: CT ABDOMEN WITHOUT CONTRAST TECHNIQUE: Multidetector CT imaging of the abdomen was performed following the standard protocol without IV contrast. CT imaging of the pelvis was not ordered. No oral contrast administered. Sagittal and coronal MPR images reconstructed from axial data set. COMPARISON:  09/13/2014 FINDINGS: Lower chest: Minimal bibasilar atelectasis. Tiny posterior diaphragmatic defects bilaterally containing fat. Hepatobiliary: Gallbladder and liver normal appearance Pancreas: Normal appearance Spleen: Normal appearance Adrenals/Urinary Tract: Adrenal glands normal appearance. BILATERAL renal cysts. No hydronephrosis or urinary tract calcification. Visualized ureters unremarkable. Stomach/Bowel: Normal appendix. Distended stomach tending fluid. Diverticulosis of descending colon without evidence of diverticulitis. Several small bowel loops extend into a large  bilobed umbilical hernia. Dilated proximal and decompressed distal small bowel loops consistent with obstruction, secondary to bowel herniation into umbilical hernia. Some small bowel loops and portions of the colon are not evaluated since  the pelvis is not imaged. Vascular/Lymphatic: Atherosclerotic calcification within common iliac arteries proximally. No adenopathy. Other: Stranding of fat within the hernia sac which may reflect small bowel obstruction or incarceration. No free air. No abscess or perforation. Musculoskeletal: Unremarkable IMPRESSION: Large bilobed umbilical hernia containing several small bowel loops and causing small bowel obstruction. Stranding of fat within the hernia sac which may may be due to small bowel obstruction or incarceration of the hernia. Distal colonic diverticulosis without evidence of diverticulitis. Pelvis not imaged. Aortic Atherosclerosis (ICD10-I70.0). These results will be called to the ordering clinician or representative by the Radiologist Assistant, and communication documented in the PACS or [employer] Dashboard. Electronically Signed: By: Ulyses SouthwardMark  Boles M.D. On: 05/22/2020 15:28    Review of Systems  Constitutional: Negative.   HENT: Negative.   Eyes: Negative.   Respiratory: Negative.   Cardiovascular: Negative.   Gastrointestinal: Positive for abdominal pain, nausea and vomiting.  Endocrine: Negative.   Genitourinary: Negative.   Musculoskeletal: Negative.   Skin: Negative.   Allergic/Immunologic: Negative.   Neurological: Negative.   Hematological: Negative.   Psychiatric/Behavioral: Negative.   All other systems reviewed and are negative.   Blood pressure (!) 158/80, pulse 67, temperature 99.1 F (37.3 C), resp. rate 19, SpO2 96 %. Physical Exam Constitutional:      General: He is not in acute distress.    Appearance: He is well-developed. He is obese. He is not ill-appearing or diaphoretic.  HENT:     Head: Normocephalic and atraumatic.      Mouth/Throat:     Mouth: Mucous membranes are moist.     Pharynx: Oropharynx is clear.  Eyes:     General: No scleral icterus.    Extraocular Movements: Extraocular movements intact.     Pupils: Pupils are equal, round, and reactive to light.  Cardiovascular:     Rate and Rhythm: Normal rate and regular rhythm.  Pulmonary:     Effort: Pulmonary effort is normal. No respiratory distress.     Breath sounds: No stridor.  Chest:     Chest wall: No tenderness.  Abdominal:     General: Abdomen is protuberant. Bowel sounds are decreased.     Palpations: Abdomen is soft. There is no shifting dullness, fluid wave, hepatomegaly, splenomegaly, mass or pulsatile mass.     Tenderness: There is abdominal tenderness (only at red area with cellulitis) in the periumbilical area. There is no guarding or rebound. Negative signs include Murphy's sign, Rovsing's sign and McBurney's sign.     Hernia: A hernia is present. Hernia is present in the ventral area.     Comments: Hernia at and below the umbilicus with bowel sounds in it.  Some cellulitis present below umbilicus.  Cannot reduce.    Skin:    General: Skin is warm and dry.     Capillary Refill: Capillary refill takes 2 to 3 seconds.     Coloration: Skin is not cyanotic, jaundiced, mottled or pale.     Findings: Erythema present. No rash.  Neurological:     General: No focal deficit present.     Mental Status: He is alert and oriented to person, place, and time.     Cranial Nerves: No cranial nerve deficit.     Motor: No weakness.  Psychiatric:        Mood and Affect: Mood normal. Mood is not anxious or depressed.        Behavior: Behavior normal.      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  Will not  place mesh given the cellulitis present.    I discussed risks of surgery including bleeding, infection, damage to adjacent structures, possible leak from bowel whether resected or not, possible ostomy, heart or lung complications, death, blood clot, prolonged hospitalization, and more  I discussed 12 week post op recovery in terms of no lifting or strenuous activity.   I also discussed >30% risk of recurrent hernia in next few years.    Pt agrees to proceed.     Almond Lint, MD 05/23/2020, 2:19 PM

## 2020-05-23 NOTE — Interval H&P Note (Signed)
History and Physical Interval Note:  05/23/2020 6:52 PM  Jesus [name]  has presented today for surgery, with the diagnosis of SMALL BOWEL OBSTRUCTION.  The various methods of treatment have been discussed with the patient and family. After consideration of risks, benefits and other options for treatment, the patient has consented to  Procedure(s): EXPLORATORY LAPAROTOMY POSSIBLE  HERNIA REPAIR POSSIBLE BOWEL RESECTION (N/A) as a surgical intervention.  The patient's history has been reviewed, patient examined, no change in status, stable for surgery.  I have reviewed the patient's chart and labs.  Questions were answered to the patient's satisfaction.     Alfard Cochrane A Midas Daughety   

## 2020-05-23 NOTE — Op Note (Signed)
Operative Note  Jesus [name]  [id number]  [id number]  05/23/2020   Surgeon: Leeroy Bock A ConnorMD  Procedure performed: exploratory laparotomy, lysis of adhesions x45 minutes, primary repair of incarcerated recurrent ventral incisional hernia  Preop diagnosis: Incarcerated recurrent ventral incisional hernia, small bowel obstruction  Post-op diagnosis/intraop findings: Same, internal hernia within the incarcerated ventral hernia causing a closed-loop obstruction with bowel which initially looked ischemic but after a few minutes appeared viable and peristaltic  Specimens: no Retained items: no EBL: 50cc Complications: none  Description of procedure: After obtaining informed consent the patient was taken to the operating room.  A nasogastric tube was placed by the anesthesiology team prior to induction with evacuation of nearly 2 L from the patient's stomach.  He was placed supine on operating room table wheregeneral endotracheal anesthesia was initiated, preoperative antibiotics were administered, SCDs applied, Foley catheter inserted and a formal timeout was performed.  The abdomen was prepped and draped in usual sterile fashion.    A midline vertical incision was made and the hernia sac carefully entered.  Bloody ascites was encountered.  Careful adhesiolysis with a combination of blunt, cautery and sharp dissection ensued to free omental adhesions and bowel adhesions from within the multilobulated hernia sac until the incision could be completed.  There was an omental band within the hernia which was causing a closed-loop obstruction of a loop of bowel.  This initially appeared ischemic and preparations were made to perform a small bowel resection.  Once all adhesions were freed the bowel was all reduced into the abdomen for several minutes while the fascial edges were cleaned off and the hernia sac was excised using a combination of blunt and cautery dissection.  The abdominal wall is  quite attenuated.   The viscera were all then reinspected.  The NG tube is palpable in the stomach in appropriate position.  The colon appears uninvolved and has no injury from adhesiolysis.  The small bowel was then run from the ligament of Treitz to the ileocecal valve several times and the area which had previously appeared ischemic now appeared viable, well perfused and peristaltic.  Following the bowel distally to the second point of obstruction at the neck of the hernia there is inflammatory adhesion on the serosa of the small bowel but no apparent serosal injury, stricture or obstruction.  Small bowel contents were milked from proximal to distal to confirm patency.  Ultimately all the bowel appeared viable and patent and no bowel obstruction was necessary  The bowel was returned to the abdominal cavity in appropriate anatomic orientation and the omentum was brought down over the bowel.  The abdomen was irrigated with 2 L of warm sterile saline and inspected for hemostasis which was excellent.  The fascia was closed with running looped #1 PDS starting at either end and tying centrally.  This was reinforced with 4 simple interrupted #1 Novafil's.  The dead space in the subcutaneous tissues was closed down using several interrupted 3-0 Vicryls and then the skin was closed with staples followed by application of a honeycomb dressing.   The patient was then awakened, extubated and taken to PACU in stable condition.   All counts were correct at the completion of the case.   I updated his wife by phone at the end of the case.

## 2020-05-23 NOTE — Transfer of Care (Signed)
Immediate Anesthesia Transfer of Care Note  Patient: Jesus [name]  Procedure(s) Performed: EXPLORATORY LAPAROTOMY, LYSIS OF ADHESIONS, PRIMARY REPAIR AND REVISION RECURRENT VENTRAL HERNIA (N/A Abdomen)  Patient Location: PACU  Anesthesia Type:General  Level of Consciousness: awake, alert  and oriented  Airway & Oxygen Therapy: Patient Spontanous Breathing and Patient connected to nasal cannula oxygen  Post-op Assessment: Report given to RN and Post -op Vital signs reviewed and stable  Post vital signs: Reviewed and stable  Last Vitals:  Vitals Value Taken Time  BP 156/91 05/23/20 2150  Temp    Pulse 88 05/23/20 2152  Resp 22 05/23/20 2152  SpO2 100 % 05/23/20 2152  Vitals shown include unvalidated device data.  Last Pain:  Vitals:   05/23/20 1730  TempSrc: Oral  PainSc:          Complications: No complications documented.

## 2020-05-23 NOTE — Anesthesia Preprocedure Evaluation (Addendum)
Anesthesia Evaluation  Patient identified by MRN, date of birth, ID band Patient awake    Reviewed: Allergy & Precautions, NPO status , Patient's Chart, lab work & pertinent test results  Airway Mallampati: III  TM Distance: >3 FB Neck ROM: Full    Dental   Gold left upper central:   Pulmonary former smoker, PE   Pulmonary exam normal breath sounds clear to auscultation       Cardiovascular hypertension, Pt. on home beta blockers and Pt. on medications Normal cardiovascular exam Rhythm:Regular Rate:Normal  ECG: ST, Rate 114   Neuro/Psych Anxiety negative neurological ROS     GI/Hepatic negative GI ROS, Neg liver ROS, N/V   Endo/Other  negative endocrine ROS  Renal/GU negative Renal ROS     Musculoskeletal negative musculoskeletal ROS (+)   Abdominal   Peds  Hematology negative hematology ROS (+)   Anesthesia Other Findings SMALL OBSTRUCTION  Reproductive/Obstetrics                            Anesthesia Physical Anesthesia Plan  ASA: III and emergent  Anesthesia Plan: General   Post-op Pain Management:    Induction: Intravenous and Rapid sequence  PONV Risk Score and Plan: 2 and Ondansetron, Dexamethasone and Treatment may vary due to age or medical condition  Airway Management Planned: Oral ETT  Additional Equipment:   Intra-op Plan:   Post-operative Plan: Extubation in OR  Informed Consent: I have reviewed the patients History and Physical, chart, labs and discussed the procedure including the risks, benefits and alternatives for the proposed anesthesia with the patient or authorized representative who has indicated his/her understanding and acceptance.     Dental advisory given  Plan Discussed with: CRNA  Anesthesia Plan Comments:      Anesthesia Quick Evaluation

## 2020-05-23 NOTE — Anesthesia Procedure Notes (Signed)
Procedure Name: Intubation Date/Time: 05/23/2020 7:43 PM Performed by: Elliot Dally, CRNA Pre-anesthesia Checklist: Patient identified, Emergency Drugs available, Suction available and Patient being monitored Patient Re-evaluated:Patient Re-evaluated prior to induction Oxygen Delivery Method: Circle System Utilized Preoxygenation: Pre-oxygenation with 100% oxygen Induction Type: IV induction, Rapid sequence and Cricoid Pressure applied Laryngoscope Size: Miller and 3 Grade View: Grade I Tube type: Oral Tube size: 7.5 mm Number of attempts: 1 Airway Equipment and Method: Stylet and Oral airway Placement Confirmation: ETT inserted through vocal cords under direct vision,  positive ETCO2 and breath sounds checked- equal and bilateral Secured at: 24 cm Tube secured with: Tape Dental Injury: Teeth and Oropharynx as per pre-operative assessment  Comments: Vocal cords were clear of gastric contents on intubation

## 2020-05-23 NOTE — Op Note (Incomplete)
Repair of recurrent incarcerated ventral hernia with obstruction  Indications: Pt presented with acutely incarcerated hernia over the last 3 days with nausea and vomiting.  CT showed hernia with multiple loops of small bowel with proximally dilated loops.    Pre-operative Diagnosis: recurrent incarcerated ventral hernia  Post-operative Diagnosis: Same  Surgeon: Almond Lint   Assistants: ***  Anesthesia: General endotracheal anesthesia  ASA Class: {numbers; 1-5:16092}  Procedure Details  The patient was seen in the Holding Room. The risks, benefits, complications, treatment options, and expected outcomes were discussed with the patient. The possibilities of reaction to medication, pulmonary aspiration, perforation of viscus, bleeding, recurrent infection, the need for additional procedures, failure to diagnose a condition, and creating a complication requiring transfusion or operation were discussed with the patient. The patient concurred with the proposed plan, giving informed consent.  The site of surgery properly noted/marked. The patient was taken to the operating room, identified as Jesus [name] and the procedure verified as ventral hernia repair. A Time Out was held and the above information confirmed.  The patient was placed supine.  After establishing general anesthesia, the abdomen was prepped with Chloraprep and draped in sterile fashion.  We made a vertical incision over the palpable hernia. Dissection was carried down to the hernia sac located above the fascia and mobilized from surrounding structures.  Intact fascia was identified circumferentially around the defect.    ***    The fascial defect was reapproximated with *** sutures.  The subcutaneous tissues were irrigated. The skin was closed with staples and dressed with dry sterile dressing.    Instrument, sponge, and needle counts were correct prior to closure and at the conclusion of the case.   Findings:  Cellulitis of  overlying skin.  Small bowel in hernia sac.  ***No evidence of gangrene.   Estimated Blood Loss:  {ebl:31738}         Drains: ***                    Complications:  None; patient tolerated the procedure well.         Disposition: PACU - hemodynamically stable.         Condition: stable

## 2020-05-23 NOTE — Interval H&P Note (Deleted)
History and Physical Interval Note:  05/23/2020 6:52 PM  Jesus [name]  has presented today for surgery, with the diagnosis of SMALL BOWEL OBSTRUCTION.  The various methods of treatment have been discussed with the patient and family. After consideration of risks, benefits and other options for treatment, the patient has consented to  Procedure(s): EXPLORATORY LAPAROTOMY POSSIBLE  HERNIA REPAIR POSSIBLE BOWEL RESECTION (N/A) as a surgical intervention.  The patient's history has been reviewed, patient examined, no change in status, stable for surgery.  I have reviewed the patient's chart and labs.  Questions were answered to the patient's satisfaction.     Everett Ricciardelli Lollie Sails

## 2020-05-23 NOTE — ED Provider Notes (Signed)
[facility] EMERGENCY DEPARTMENT Provider Note   CSN: [id number] Arrival date & time: 05/23/20  1055     History Chief Complaint  Patient presents with  . Abdominal Pain    Jesus [name] is a 75 y.o. male.  Patient c/o bowel obstruction. States 3-4 days ago, acute onset mid to lower abd pain, nausea/vomiting, and abd distension. Symptoms moderate, constant, persistent, slowly worse. Notes hx umbilical hernia, and surgery for same years prior in [place], but notes hernia has recurred. Emesis brownish/green. Did have small bm yesterday, but passing less gas, feels constipated/bloated. Denies fever/chills. No back or flank pain. No chest pain or sob. Had outpatient ct yesterday c/w sbo.   The history is provided by the patient and the EMS personnel.  Abdominal Pain Associated symptoms: nausea and vomiting   Associated symptoms: no chest pain, no dysuria, no fever, no shortness of breath and no sore throat        Past Medical History:  Diagnosis Date  . Anxiety   . History of pulmonary embolism 09/17/2014  . Hydrocele, right   . Hypertension   . Spermatocele   . Urinary hesitancy     Patient Active Problem List   Diagnosis Date Noted  . Obesity (BMI 30.0-34.9) 05/08/2019  . Cardiovascular disease 05/08/2019  . History of pulmonary embolism 05/08/2019  . Acute pulmonary embolism (HCC) 09/17/2014  . Essential hypertension 09/17/2014  . Tachycardia 09/17/2014  . Elevated troponin 09/17/2014  . Small bowel obstruction (HCC) 09/13/2014  . Hydrocele, right 06/20/2011  . 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 HYDROCELE EXCISION  06/20/2011   Procedure: HYDROCELECTOMY ADULT;  Surgeon: Valetta Fuller, MD;  Location: [facility];  Service: Urology;  Laterality: Right;  . SCROTAL EXPLORATION  06/20/2011   Procedure: SCROTUM EXPLORATION;  Surgeon: Valetta Fuller, MD;  Location: [facility];   Service: Urology;  Laterality: Right;  . SPERMATOCELECTOMY  06/20/2011   Procedure: SPERMATOCELECTOMY;  Surgeon: Valetta Fuller, MD;  Location: [facility];  Service: Urology;  Laterality: Right;  . TRANSTHORACIC ECHOCARDIOGRAM  09/17/2014   Normal LV size and function.  EF 55 to 60%.  No R WMA.  RV not well seen.  [name] UMBILICAL HERNIA REPAIR  1998       Family History  Problem Relation Age of Onset  . Cancer Father   . Healthy Sister   . Healthy Brother   . Healthy Sister     Social History   Tobacco Use  . Smoking status: Former Smoker    Types: Cigarettes    Quit date: 03/28/1958    Years since quitting: 62.1  . Smokeless tobacco: Never Used  Substance Use Topics  . Alcohol use: Yes    Comment: 1-2 drinks per week  . Drug use: No    Home Medications Prior to Admission medications   Medication Sig Start Date End Date Taking? Authorizing Provider  aliskiren (TEKTURNA) 150 MG tablet Take 1 tablet (150 mg total) by mouth every morning. 06/20/19   Marykay Lex, MD  apixaban (ELIQUIS) 5 MG TABS tablet Please take  2 tablets (10 mg) oral two times  daily for 6 days (until 09/26/2014), then take 1 tablet (5 mg) oral two times daily thereafter. 09/21/14   Elgergawy, Leana Roe, MD  chlorthalidone (HYGROTON) 25 MG tablet Take 0.5 tablets (12.5 mg total) by mouth daily as needed. 07/02/19 09/30/19  Marykay Lex,  MD  Cholecalciferol (D3-1000 PO) Take 1 tablet by mouth daily.    [provider]  cloNIDine (CATAPRES) 0.2 MG tablet Take 0.2 mg by mouth daily.    [provider]  HYDROcodone-acetaminophen (NORCO) 10-325 MG tablet as needed. 06/11/19   [provider]  LORazepam (ATIVAN) 2 MG tablet Take 2 mg by mouth every 8 (eight) hours as needed for anxiety. 10/26/16   [provider]  Multiple Vitamin (MULTIVITAMIN WITH MINERALS) TABS tablet Take 1 tablet by mouth daily.    [provider]  Nebivolol HCl (BYSTOLIC) 20 MG TABS Take 1  tablet (20 mg total) by mouth at bedtime. 06/20/19   Marykay LexHarding, David W, MD  Tamsulosin HCl (FLOMAX) 0.4 MG CAPS Take 0.4 mg by mouth daily.    [provider]    Allergies    Patient has no known allergies.  Review of Systems   Review of Systems  Constitutional: Negative for fever.  HENT: Negative for sore throat.   Eyes: Negative for redness.  Respiratory: Negative for shortness of breath.   Cardiovascular: Negative for chest pain.  Gastrointestinal: Positive for abdominal distention, abdominal pain, nausea and vomiting.  Genitourinary: Negative for dysuria and flank pain.  Musculoskeletal: Negative for back pain and neck pain.  Skin: Negative for rash.  Neurological: Negative for headaches.  Hematological: Does not bruise/bleed easily.  Psychiatric/Behavioral: Negative for confusion.    Physical Exam Updated Vital Signs BP 128/83 (BP Location: Left Arm)   Pulse (!) 36   Temp 99.1 F (37.3 C)   Resp 16   SpO2 98%   Physical Exam Vitals and nursing note reviewed.  Constitutional:      Appearance: Normal appearance. He is well-developed.  HENT:     Head: Atraumatic.     Nose: Nose normal.     Mouth/Throat:     Mouth: Mucous membranes are moist.     Pharynx: Oropharynx is clear.  Eyes:     General: No scleral icterus.    Conjunctiva/sclera: Conjunctivae normal.  Neck:     Trachea: No tracheal deviation.  Cardiovascular:     Rate and Rhythm: Normal rate and regular rhythm.     Pulses: Normal pulses.     Heart sounds: Normal heart sounds. No murmur heard. No friction rub. No gallop.   Pulmonary:     Effort: Pulmonary effort is normal. No accessory muscle usage or respiratory distress.     Breath sounds: Normal breath sounds.  Abdominal:     General: Bowel sounds are normal. There is distension.     Palpations: Abdomen is soft.     Tenderness: There is abdominal tenderness. There is no guarding.     Hernia: A hernia is present.     Comments: Periumbilical  and lower abd tenderness. No peritoneal signs. +umbilical hernia, not reducible.   Genitourinary:    Comments: No cva tenderness. Musculoskeletal:        General: No swelling.     Cervical back: Normal range of motion and neck supple. No rigidity.  Skin:    General: Skin is warm and dry.     Findings: No rash.  Neurological:     Mental Status: He is alert.     Comments: Alert, speech clear.   Psychiatric:        Mood and Affect: Mood normal.     ED Results / Procedures / Treatments   Labs (all labs ordered are listed, but only abnormal results are displayed) Results for  orders placed or performed during the hospital encounter of 05/23/20  Lipase, blood  Result Value Ref Range   Lipase 30 11 - 51 U/L  Comprehensive metabolic panel  Result Value Ref Range   Sodium 139 135 - 145 mmol/L   Potassium 3.2 (L) 3.5 - 5.1 mmol/L   Chloride 96 (L) 98 - 111 mmol/L   CO2 27 22 - 32 mmol/L   Glucose, Bld 118 (H) 70 - 99 mg/dL   BUN 31 (H) 8 - 23 mg/dL   Creatinine, Ser 8.89 0.61 - 1.24 mg/dL   Calcium 16.9 (H) 8.9 - 10.3 mg/dL   Total Protein 7.8 6.5 - 8.1 g/dL   Albumin 4.5 3.5 - 5.0 g/dL   AST 38 15 - 41 U/L   ALT 24 0 - 44 U/L   Alkaline Phosphatase 51 38 - 126 U/L   Total Bilirubin 1.9 (H) 0.3 - 1.2 mg/dL   GFR, Estimated >45 >03 mL/min   Anion gap 16 (H) 5 - 15  CBC  Result Value Ref Range   WBC 9.0 4.0 - 10.5 K/uL   RBC 5.92 (H) 4.22 - 5.81 MIL/uL   Hemoglobin 17.1 (H) 13.0 - 17.0 g/dL   HCT 88.8 28.0 - 03.4 %   MCV 86.1 80.0 - 100.0 fL   MCH 28.9 26.0 - 34.0 pg   MCHC 33.5 30.0 - 36.0 g/dL   RDW 91.7 91.5 - 05.6 %   Platelets 188 150 - 400 K/uL   nRBC 0.0 0.0 - 0.2 %  APTT  Result Value Ref Range   aPTT 33 24 - 36 seconds  Protime-INR  Result Value Ref Range   Prothrombin Time 15.3 (H) 11.4 - 15.2 seconds   INR 1.3 (H) 0.8 - 1.2  Type and screen  Result Value Ref Range   ABO/RH(D) A POS    Antibody Screen NEG    Sample Expiration       05/26/2020,2359 Performed at [facility], [address] [place], [place] [address]    CT ABDOMEN WO CONTRAST  Addendum Date: 05/22/2020   ADDENDUM REPORT: 05/22/2020 16:09 ADDENDUM: Findings discussed with patient by phone on 05/22/2020 at 1605 hours. Patient indicated that he had already spoken with his physician concerning the exam. Electronically Signed   By: Ulyses Southward M.D.   On: 05/22/2020 16:09   Result Date: 05/22/2020 CLINICAL DATA:  Nausea and constipation for few days, history of hernia repair EXAM: CT ABDOMEN WITHOUT CONTRAST TECHNIQUE: Multidetector CT imaging of the abdomen was performed following the standard protocol without IV contrast. CT imaging of the pelvis was not ordered. No oral contrast administered. Sagittal and coronal MPR images reconstructed from axial data set. COMPARISON:  09/13/2014 FINDINGS: Lower chest: Minimal bibasilar atelectasis. Tiny posterior diaphragmatic defects bilaterally containing fat. Hepatobiliary: Gallbladder and liver normal appearance Pancreas: Normal appearance Spleen: Normal appearance Adrenals/Urinary Tract: Adrenal glands normal appearance. BILATERAL renal cysts. No hydronephrosis or urinary tract calcification. Visualized ureters unremarkable. Stomach/Bowel: Normal appendix. Distended stomach tending fluid. Diverticulosis of descending colon without evidence of diverticulitis. Several small bowel loops extend into a large bilobed umbilical hernia. Dilated proximal and decompressed distal small bowel loops consistent with obstruction, secondary to bowel herniation into umbilical hernia. Some small bowel loops and portions of the colon are not evaluated since the pelvis is not imaged. Vascular/Lymphatic: Atherosclerotic calcification within common iliac arteries proximally. No adenopathy. Other: Stranding of fat within the hernia sac which may reflect small bowel obstruction or incarceration. No  free air. No abscess or perforation.  Musculoskeletal: Unremarkable IMPRESSION: Large bilobed umbilical hernia containing several small bowel loops and causing small bowel obstruction. Stranding of fat within the hernia sac which may may be due to small bowel obstruction or incarceration of the hernia. Distal colonic diverticulosis without evidence of diverticulitis. Pelvis not imaged. Aortic Atherosclerosis (ICD10-I70.0). These results will be called to the ordering clinician or representative by the Radiologist Assistant, and communication documented in the PACS or [employer]. Electronically Signed: By: Ulyses Southward M.D. On: 05/22/2020 15:28    EKG EKG Interpretation  Date/Time:  Saturday May 23 2020 11:04:53 EST Ventricular Rate:  114 PR Interval:  154 QRS Duration: 70 QT Interval:  296 QTC Calculation: 407 R Axis:   0 Text Interpretation: Sinus tachycardia with frequent Premature ventricular complexes Non-specific ST-t changes Confirmed by Cathren Laine ([id number]) on 05/23/2020 11:41:26 AM   Radiology CT ABDOMEN WO CONTRAST  Addendum Date: 05/22/2020   ADDENDUM REPORT: 05/22/2020 16:09 ADDENDUM: Findings discussed with patient by phone on 05/22/2020 at 1605 hours. Patient indicated that he had already spoken with his physician concerning the exam. Electronically Signed   By: Ulyses Southward M.D.   On: 05/22/2020 16:09   Result Date: 05/22/2020 CLINICAL DATA:  Nausea and constipation for few days, history of hernia repair EXAM: CT ABDOMEN WITHOUT CONTRAST TECHNIQUE: Multidetector CT imaging of the abdomen was performed following the standard protocol without IV contrast. CT imaging of the pelvis was not ordered. No oral contrast administered. Sagittal and coronal MPR images reconstructed from axial data set. COMPARISON:  09/13/2014 FINDINGS: Lower chest: Minimal bibasilar atelectasis. Tiny posterior diaphragmatic defects bilaterally containing fat. Hepatobiliary: Gallbladder and liver normal appearance Pancreas: Normal  appearance Spleen: Normal appearance Adrenals/Urinary Tract: Adrenal glands normal appearance. BILATERAL renal cysts. No hydronephrosis or urinary tract calcification. Visualized ureters unremarkable. Stomach/Bowel: Normal appendix. Distended stomach tending fluid. Diverticulosis of descending colon without evidence of diverticulitis. Several small bowel loops extend into a large bilobed umbilical hernia. Dilated proximal and decompressed distal small bowel loops consistent with obstruction, secondary to bowel herniation into umbilical hernia. Some small bowel loops and portions of the colon are not evaluated since the pelvis is not imaged. Vascular/Lymphatic: Atherosclerotic calcification within common iliac arteries proximally. No adenopathy. Other: Stranding of fat within the hernia sac which may reflect small bowel obstruction or incarceration. No free air. No abscess or perforation. Musculoskeletal: Unremarkable IMPRESSION: Large bilobed umbilical hernia containing several small bowel loops and causing small bowel obstruction. Stranding of fat within the hernia sac which may may be due to small bowel obstruction or incarceration of the hernia. Distal colonic diverticulosis without evidence of diverticulitis. Pelvis not imaged. Aortic Atherosclerosis (ICD10-I70.0). These results will be called to the ordering clinician or representative by the Radiologist Assistant, and communication documented in the PACS or [employer]. Electronically Signed: By: Ulyses Southward M.D. On: 05/22/2020 15:28    Procedures Procedures   Medications Ordered in ED Medications  sodium chloride 0.9 % bolus 1,000 mL (has no administration in time range)  ondansetron (ZOFRAN) injection 4 mg (has no administration in time range)  HYDROmorphone (DILAUDID) injection 0.5 mg (has no administration in time range)    ED Course  I have reviewed the triage vital signs and the nursing notes.  Pertinent labs & imaging results that  were available during my care of the patient were reviewed by me and considered in my medical decision making (see chart for details).    MDM Rules/Calculators/A&P  Iv ns bolus. Stat labs. zofran iv. Dilaudid .5 ,g iv. Continuous pulse ox and cardiac monitoring.   MDM Number of Diagnoses or Management Options Incarcerated umbilical hernia: new, needed workup Small bowel obstruction (HCC): new, needed workup   Amount and/or Complexity of Data Reviewed Clinical lab tests: ordered and reviewed Tests in the radiology section of CPT: ordered and reviewed Tests in the medicine section of CPT: ordered and reviewed Discussion of test results with the performing providers: yes Decide to obtain previous medical records or to obtain history from someone other than the patient: yes Obtain history from someone other than the patient: yes Review and summarize past medical records: yes Discuss the patient with other providers: yes Independent visualization of images, tracings, or specimens: yes  Risk of Complications, Morbidity, and/or Mortality Presenting problems: high Diagnostic procedures: high Management options: high   Reviewed nursing notes and prior charts for additional history.   CT reviewed/interpreted by me - c/w incarcerated hernia/bowel obstruction.   Labs reviewed/interpreted by me - k milldy low.   IV NS bolus. NG to LIWS. General surgery consulted. Discussed pt with general surgery, Dr Donell Beers, they will see in ED and admit.      Final Clinical Impression(s) / ED Diagnoses Final diagnoses:  None    Rx / DC Orders ED Discharge Orders    None       Cathren Laine, MD 05/23/20 1302

## 2020-05-23 NOTE — ED Triage Notes (Signed)
Pt to triage via [facility] from home.  Reports generalized abd pain, nausea, and vomiting x 3 days.  Outpatient CT yesterday showed hernia and small bowel obstruction.  20g IV L hand and Zofran 4mg  IV given PTA.

## 2020-05-24 ENCOUNTER — Encounter (HOSPITAL_COMMUNITY): Admission: EM | Disposition: A | Discharge status: Home / Self Care | Payer: Self-pay | Source: Home / Self Care

## 2020-05-24 ENCOUNTER — Encounter (HOSPITAL_COMMUNITY): Payer: Self-pay | Admitting: Surgery

## 2020-05-24 LAB — CBC
HCT: 43.6 % (ref 39.0–52.0)
Hemoglobin: 14.9 g/dL (ref 13.0–17.0)
MCH: 29.4 pg (ref 26.0–34.0)
MCHC: 34.2 g/dL (ref 30.0–36.0)
MCV: 86 fL (ref 80.0–100.0)
Platelets: 152 10*3/uL (ref 150–400)
RBC: 5.07 MIL/uL (ref 4.22–5.81)
RDW: 13.8 % (ref 11.5–15.5)
WBC: 8.7 10*3/uL (ref 4.0–10.5)
nRBC: 0 % (ref 0.0–0.2)

## 2020-05-24 LAB — BASIC METABOLIC PANEL
Anion gap: 13 (ref 5–15)
BUN: 28 mg/dL — ABNORMAL HIGH (ref 8–23)
CO2: 28 mmol/L (ref 22–32)
Calcium: 9.5 mg/dL (ref 8.9–10.3)
Chloride: 101 mmol/L (ref 98–111)
Creatinine, Ser: 1.23 mg/dL (ref 0.61–1.24)
GFR, Estimated: >60 mL/min (ref >60)
Glucose, Bld: 118 mg/dL — ABNORMAL HIGH (ref 70–99)
Potassium: 3.3 mmol/L — ABNORMAL LOW (ref 3.5–5.1)
Sodium: 142 mmol/L (ref 135–145)

## 2020-05-24 LAB — PHOSPHORUS: Phosphorus: 4.7 mg/dL — ABNORMAL HIGH (ref 2.5–4.6)

## 2020-05-24 LAB — MAGNESIUM: Magnesium: 1.7 mg/dL (ref 1.7–2.4)

## 2020-05-24 SURGERY — LAPAROTOMY, EXPLORATORY
Anesthesia: General

## 2020-05-24 MED ORDER — PHENOL 1.4 % MT LIQD
1.0000 | OROMUCOSAL | Status: DC | PRN
  Administered 2020-05-24 (×4): 1 via OROMUCOSAL
  Filled 2020-05-24: qty 177

## 2020-05-24 MED ORDER — MAGNESIUM SULFATE 2 GM/50ML IV SOLN
2.0000 g | Freq: Once | INTRAVENOUS | Status: AC
  Administered 2020-05-24: 2 g via INTRAVENOUS
  Filled 2020-05-24: qty 50

## 2020-05-24 MED ORDER — ACETAMINOPHEN 10 MG/ML IV SOLN
1000.0000 mg | Freq: Four times a day (QID) | INTRAVENOUS | Status: AC
  Administered 2020-05-24 – 2020-05-25 (×4): 1000 mg via INTRAVENOUS
  Filled 2020-05-24 (×4): qty 100

## 2020-05-24 MED ORDER — KCL IN DEXTROSE-NACL 20-5-0.45 MEQ/L-%-% IV SOLN
INTRAVENOUS | Status: DC
  Filled 2020-05-24 (×7): qty 1000

## 2020-05-24 MED ORDER — METHOCARBAMOL 1000 MG/10ML IJ SOLN
500.0000 mg | Freq: Three times a day (TID) | INTRAVENOUS | Status: DC
  Administered 2020-05-24 – 2020-05-27 (×9): 500 mg via INTRAVENOUS
  Filled 2020-05-24: qty 500
  Filled 2020-05-24 (×8): qty 5
  Filled 2020-05-24: qty 500
  Filled 2020-05-24: qty 5

## 2020-05-24 MED ORDER — POTASSIUM CHLORIDE 10 MEQ/100ML IV SOLN
10.0000 meq | INTRAVENOUS | Status: AC
  Administered 2020-05-24 (×4): 10 meq via INTRAVENOUS
  Filled 2020-05-24 (×3): qty 100

## 2020-05-24 MED ORDER — POTASSIUM CHLORIDE 10 MEQ/100ML IV SOLN
10.0000 meq | Freq: Once | INTRAVENOUS | Status: AC
  Administered 2020-05-24: 10 meq via INTRAVENOUS
  Filled 2020-05-24: qty 100

## 2020-05-24 NOTE — Progress Notes (Signed)
[facility] Progress Note  1 Day Post-Op  Subjective: CC:  Reports his abd pain feels better. C/o throat discomfort. Denies flatus or BM. Has not been OOB.   Objective: Vital signs in last 24 hours: Temp:  [97.2 F (36.2 C)-99.1 F (37.3 C)] 98.7 F (37.1 C) (02/27 0524) Pulse Rate:  [34-106] 67 (02/27 0524) Resp:  [16-25] 18 (02/27 0524) BP: (128-158)/(53-93) 130/82 (02/27 0524) SpO2:  [90 %-100 %] 90 % (02/27 0524) Last BM Date: 05/22/20 (small BM)  Intake/Output from previous day: 02/26 0701 - 02/27 0700 In: 3613.4 [I.V.:1913.4; IV Piggyback:1450] Out: 3300 [Urine:1150; Emesis/NG output:300; Blood:50] Intake/Output this shift: No intake/output data recorded.  PE: Gen:  Alert, NAD, pleasant Card:  Regular rate and rhythm, pedal pulses 2+ BL Pulm:  Normal effort, clear to auscultation bilaterally Abd: Soft, binder in place, incision with honeycomb and staples c/d/i, hypoactive BS. NG with 100 cc bilious output in cannister GU: foley out  Skin: warm and dry, no rashes  Psych: A&Ox3   Lab Results:  Recent Labs    05/23/20 1116 05/24/20 0212  WBC 9.0 8.7  HGB 17.1* 14.9  HCT 51.0 43.6  PLT 188 152   BMET Recent Labs    05/23/20 1116 05/24/20 0212  NA 139 142  K 3.2* 3.3*  CL 96* 101  CO2 27 28  GLUCOSE 118* 118*  BUN 31* 28*  CREATININE 1.20 1.23  CALCIUM 11.1* 9.5   PT/INR Recent Labs    05/23/20 1149  LABPROT 15.3*  INR 1.3*   CMP     Component Value Date/Time   NA 142 05/24/2020 0212   NA 141 06/18/2019 0945   K 3.3 (L) 05/24/2020 0212   CL 101 05/24/2020 0212   CO2 28 05/24/2020 0212   GLUCOSE 118 (H) 05/24/2020 0212   BUN 28 (H) 05/24/2020 0212   BUN 15 06/18/2019 0945   CREATININE 1.23 05/24/2020 0212   CALCIUM 9.5 05/24/2020 0212   PROT 7.8 05/23/2020 1116   PROT 6.7 06/18/2019 0945   ALBUMIN 4.5 05/23/2020 1116   ALBUMIN 4.2 06/18/2019 0945   AST 38 05/23/2020 1116   ALT 24 05/23/2020 1116   ALKPHOS 51 05/23/2020  1116   BILITOT 1.9 (H) 05/23/2020 1116   BILITOT 0.3 06/18/2019 0945   GFRNONAA >60 05/24/2020 0212   GFRAA 92 06/18/2019 0945   Lipase     Component Value Date/Time   LIPASE 30 05/23/2020 1116       Studies/Results: CT ABDOMEN WO CONTRAST  Addendum Date: 05/22/2020   ADDENDUM REPORT: 05/22/2020 16:09 ADDENDUM: Findings discussed with patient by phone on 05/22/2020 at 1605 hours. Patient indicated that he had already spoken with his physician concerning the exam. Electronically Signed   By: Ulyses Southward M.D.   On: 05/22/2020 16:09   Result Date: 05/22/2020 CLINICAL DATA:  Nausea and constipation for few days, history of hernia repair EXAM: CT ABDOMEN WITHOUT CONTRAST TECHNIQUE: Multidetector CT imaging of the abdomen was performed following the standard protocol without IV contrast. CT imaging of the pelvis was not ordered. No oral contrast administered. Sagittal and coronal MPR images reconstructed from axial data set. COMPARISON:  09/13/2014 FINDINGS: Lower chest: Minimal bibasilar atelectasis. Tiny posterior diaphragmatic defects bilaterally containing fat. Hepatobiliary: Gallbladder and liver normal appearance Pancreas: Normal appearance Spleen: Normal appearance Adrenals/Urinary Tract: Adrenal glands normal appearance. BILATERAL renal cysts. No hydronephrosis or urinary tract calcification. Visualized ureters unremarkable. Stomach/Bowel: Normal appendix. Distended stomach tending fluid. Diverticulosis of descending colon without  evidence of diverticulitis. Several small bowel loops extend into a large bilobed umbilical hernia. Dilated proximal and decompressed distal small bowel loops consistent with obstruction, secondary to bowel herniation into umbilical hernia. Some small bowel loops and portions of the colon are not evaluated since the pelvis is not imaged. Vascular/Lymphatic: Atherosclerotic calcification within common iliac arteries proximally. No adenopathy. Other: Stranding of fat  within the hernia sac which may reflect small bowel obstruction or incarceration. No free air. No abscess or perforation. Musculoskeletal: Unremarkable IMPRESSION: Large bilobed umbilical hernia containing several small bowel loops and causing small bowel obstruction. Stranding of fat within the hernia sac which may may be due to small bowel obstruction or incarceration of the hernia. Distal colonic diverticulosis without evidence of diverticulitis. Pelvis not imaged. Aortic Atherosclerosis (ICD10-I70.0). These results will be called to the ordering clinician or representative by the Radiologist Assistant, and communication documented in the PACS or [employer]. Electronically Signed: By: Ulyses Southward M.D. On: 05/22/2020 15:28    Anti-infectives: Anti-infectives (From admission, onward)   Start     Dose/Rate Route Frequency Ordered Stop   05/24/20 0600  cefOXitin (MEFOXIN) 2 g in sodium chloride 0.9 % 100 mL IVPB        2 g 200 mL/hr over 30 Minutes Intravenous On call to O.R. 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 Doylene Canard - POD#0, AFVSS  - just got out of surgery late last night. continue NG to LIWS and await bowel function. Ok for limited ice chips and one popsicle today - OOB and mobilize - IS/pulm toilet - pain: IV tylenol, schedule IV robaxin, PRN IV dilaudid   FEN: NPO, IVF, NG tube LIWS ID: perioperative cefoxitin x1 dose Foley: removed VTE: lovenox, SCDs Dispo: med-surg, await bowel function   LOS: 1 day    Hosie Spangle, [facility] Please see Amion for pager number during day hours 7:00am-4:30pm

## 2020-05-24 NOTE — Progress Notes (Signed)
Arrived to 6N14 @2245 , alert and oriented x4, able to make needs known. Honeycomb dressing C/D/I. Abdominal binder in place. NGT to LIWS in R nare- greenish output noted.  V/S stable. Call bell within reach and will continue to close monitor patient.

## 2020-05-25 ENCOUNTER — Encounter (HOSPITAL_COMMUNITY): Payer: Self-pay

## 2020-05-25 LAB — CBC
HCT: 41.5 % (ref 39.0–52.0)
Hemoglobin: 14.1 g/dL (ref 13.0–17.0)
MCH: 29.7 pg (ref 26.0–34.0)
MCHC: 34 g/dL (ref 30.0–36.0)
MCV: 87.6 fL (ref 80.0–100.0)
Platelets: 158 10*3/uL (ref 150–400)
RBC: 4.74 MIL/uL (ref 4.22–5.81)
RDW: 13.9 % (ref 11.5–15.5)
WBC: 5.2 10*3/uL (ref 4.0–10.5)
nRBC: 0 % (ref 0.0–0.2)

## 2020-05-25 LAB — BASIC METABOLIC PANEL
Anion gap: 12 (ref 5–15)
BUN: 26 mg/dL — ABNORMAL HIGH (ref 8–23)
CO2: 27 mmol/L (ref 22–32)
Calcium: 8.9 mg/dL (ref 8.9–10.3)
Chloride: 102 mmol/L (ref 98–111)
Creatinine, Ser: 1.46 mg/dL — ABNORMAL HIGH (ref 0.61–1.24)
GFR, Estimated: 51 mL/min — ABNORMAL LOW (ref >60)
Glucose, Bld: 114 mg/dL — ABNORMAL HIGH (ref 70–99)
Potassium: 3.6 mmol/L (ref 3.5–5.1)
Sodium: 141 mmol/L (ref 135–145)

## 2020-05-25 LAB — PHOSPHORUS: Phosphorus: 2.2 mg/dL — ABNORMAL LOW (ref 2.5–4.6)

## 2020-05-25 LAB — MAGNESIUM: Magnesium: 2.4 mg/dL (ref 1.7–2.4)

## 2020-05-25 MED ORDER — WHITE PETROLATUM EX OINT
TOPICAL_OINTMENT | CUTANEOUS | Status: AC
  Administered 2020-05-25: 0.2
  Filled 2020-05-25: qty 28.35

## 2020-05-25 MED ORDER — TRAMADOL HCL 50 MG PO TABS
50.0000 mg | ORAL_TABLET | Freq: Four times a day (QID) | ORAL | Status: DC | PRN
Start: 2020-05-25 — End: 2020-05-26

## 2020-05-25 MED ORDER — ACETAMINOPHEN 10 MG/ML IV SOLN
1000.0000 mg | Freq: Four times a day (QID) | INTRAVENOUS | Status: AC
  Administered 2020-05-25 – 2020-05-26 (×4): 1000 mg via INTRAVENOUS
  Filled 2020-05-25 (×4): qty 100

## 2020-05-25 MED ORDER — LACTATED RINGERS IV BOLUS
1000.0000 mL | Freq: Once | INTRAVENOUS | Status: AC
  Administered 2020-05-25: 1000 mL via INTRAVENOUS

## 2020-05-25 NOTE — Progress Notes (Addendum)
2 Days Post-Op  Subjective: CC: Patient reporting some soreness around his midline wound. He has been having nausea, burping and belching. 1.85L out from NGT. ? One episode of flatus yesterday but none since. No BM. Denies chest pain or sob. Some difficulty starting stream to void but no difficulty once he begins. Voiding with last episode earlier this am. He has mobilized from the bed to the chair. Walks with a cane at baseline. Lives at home with his wife. No chest pain or sob.   Objective: Vital signs in last 24 hours: Temp:  [98.4 F (36.9 C)-100.4 F (38 C)] 99.4 F (37.4 C) (02/28 0413) Pulse Rate:  [87-95] 87 (02/28 0413) Resp:  [16-18] 16 (02/28 0413) BP: (122-126)/(68-85) 122/76 (02/28 0413) SpO2:  [95 %-98 %] 95 % (02/28 0413) Last BM Date: 05/22/20  Intake/Output from previous day: 02/27 0701 - 02/28 0700 In: 1730.3 [I.V.:1430.3; IV Piggyback:300] Out: 2400 [Urine:550; Emesis/NG output:1850] Intake/Output this shift: No intake/output data recorded.  PE: Gen:  Alert, NAD, pleasant HEENT: EOM's intact, pupils equal and round Card:  RRR Pulm:  CTAB, no W/R/R, effort normal, pulling 1250 on IS Abd: Soft, mild to moderate distension, tenderness around the midline wound that appears appropriate. No peritonitis. Midline wound with honeycomb dressing in place w/ staples below that appear c/d/i. Hypoactive bowel sounds  Ext:  No LE edema  Psych: A&Ox4 Skin: no rashes noted, warm and dry  Lab Results:  Recent Labs    05/24/20 0212 05/25/20 0037  WBC 8.7 5.2  HGB 14.9 14.1  HCT 43.6 41.5  PLT 152 158   BMET Recent Labs    05/24/20 0212 05/25/20 0037  NA 142 141  K 3.3* 3.6  CL 101 102  CO2 28 27  GLUCOSE 118* 114*  BUN 28* 26*  CREATININE 1.23 1.46*  CALCIUM 9.5 8.9   PT/INR Recent Labs    05/23/20 1149  LABPROT 15.3*  INR 1.3*   CMP     Component Value Date/Time   NA 141 05/25/2020 0037   NA 141 06/18/2019 0945   K 3.6 05/25/2020 0037   CL  102 05/25/2020 0037   CO2 27 05/25/2020 0037   GLUCOSE 114 (H) 05/25/2020 0037   BUN 26 (H) 05/25/2020 0037   BUN 15 06/18/2019 0945   CREATININE 1.46 (H) 05/25/2020 0037   CALCIUM 8.9 05/25/2020 0037   PROT 7.8 05/23/2020 1116   PROT 6.7 06/18/2019 0945   ALBUMIN 4.5 05/23/2020 1116   ALBUMIN 4.2 06/18/2019 0945   AST 38 05/23/2020 1116   ALT 24 05/23/2020 1116   ALKPHOS 51 05/23/2020 1116   BILITOT 1.9 (H) 05/23/2020 1116   BILITOT 0.3 06/18/2019 0945   GFRNONAA 51 (L) 05/25/2020 0037   GFRAA 92 06/18/2019 0945   Lipase     Component Value Date/Time   LIPASE 30 05/23/2020 1116       Studies/Results: No results found.  Anti-infectives: Anti-infectives (From admission, onward)   Start     Dose/Rate Route Frequency Ordered Stop   05/24/20 0600  cefOXitin (MEFOXIN) 2 g in sodium chloride 0.9 % 100 mL IVPB        2 g 200 mL/hr over 30 Minutes Intravenous On call to O.R. 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 Will discuss with MD timing of restarting. Hgb stable.  Elevated Cr - Cr 1.46, continue  IVF. BMP in the AM.   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 Doylene Canard - POD#2 - Intraop findings: Same, internal hernia within the incarcerated ventral hernia causing a closed-loop obstruction with bowel which initially looked ischemic but after a few minutes appeared viable and peristaltic - Patient with ileus post operatively. AROBF. Keep K>4 and Mg>2. If does not open up in the next few days will consider PICC/TPN - OOB and mobilize. PT ordered (walks with cane at baseline) - IS/pulm toilet - pain: IV tylenol, schedule IV robaxin, PRN IV dilaudid   FEN: NPO, IVF, NG tube LIWS ID: perioperative cefoxitin x1 dose. T 100.4 overnight.  WBC 5.2. Trend fever and wbc curve.  Foley: removed. Some hesitancy but voiding. Flomax  VTE: Lovenox, SCDs, anticoagulation as above.  Dispo: med-surg, await bowel function, plan as above.    LOS: 2 days    [name] , [facility] Surgery 05/25/2020, 8:32 AM Please see Amion for pager number during day hours 7:00am-4:30pm

## 2020-05-26 LAB — CBC
HCT: 41 % (ref 39.0–52.0)
Hemoglobin: 14.2 g/dL (ref 13.0–17.0)
MCH: 30 pg (ref 26.0–34.0)
MCHC: 34.6 g/dL (ref 30.0–36.0)
MCV: 86.7 fL (ref 80.0–100.0)
Platelets: 150 10*3/uL (ref 150–400)
RBC: 4.73 MIL/uL (ref 4.22–5.81)
RDW: 13.4 % (ref 11.5–15.5)
WBC: 6.9 10*3/uL (ref 4.0–10.5)
nRBC: 0 % (ref 0.0–0.2)

## 2020-05-26 LAB — BASIC METABOLIC PANEL
Anion gap: 8 (ref 5–15)
Anion gap: 10 (ref 5–15)
BUN: 15 mg/dL (ref 8–23)
BUN: 13 mg/dL (ref 8–23)
CO2: 25 mmol/L (ref 22–32)
CO2: 22 mmol/L (ref 22–32)
Calcium: 8.9 mg/dL (ref 8.9–10.3)
Calcium: 8.8 mg/dL — ABNORMAL LOW (ref 8.9–10.3)
Chloride: 105 mmol/L (ref 98–111)
Chloride: 106 mmol/L (ref 98–111)
Creatinine, Ser: 1 mg/dL (ref 0.61–1.24)
Creatinine, Ser: 0.93 mg/dL (ref 0.61–1.24)
GFR, Estimated: >60 mL/min (ref >60)
GFR, Estimated: >60 mL/min (ref >60)
Glucose, Bld: 101 mg/dL — ABNORMAL HIGH (ref 70–99)
Glucose, Bld: 87 mg/dL (ref 70–99)
Potassium: 3.4 mmol/L — ABNORMAL LOW (ref 3.5–5.1)
Potassium: 3.9 mmol/L (ref 3.5–5.1)
Sodium: 138 mmol/L (ref 135–145)
Sodium: 138 mmol/L (ref 135–145)

## 2020-05-26 LAB — URINALYSIS, ROUTINE W REFLEX MICROSCOPIC
Bilirubin Urine: NEGATIVE
Glucose, UA: NEGATIVE mg/dL
Ketones, ur: 5 mg/dL — AB
Leukocytes,Ua: NEGATIVE
Nitrite: NEGATIVE
Protein, ur: NEGATIVE mg/dL
Specific Gravity, Urine: 1.029 (ref 1.005–1.030)
pH: 6 (ref 5.0–8.0)

## 2020-05-26 LAB — PHOSPHORUS
Phosphorus: <1 mg/dL — CL (ref 2.5–4.6)
Phosphorus: 1.8 mg/dL — ABNORMAL LOW (ref 2.5–4.6)

## 2020-05-26 LAB — MAGNESIUM: Magnesium: 2 mg/dL (ref 1.7–2.4)

## 2020-05-26 MED ORDER — HYDROMORPHONE HCL 1 MG/ML IJ SOLN: 0.5000 mg | INTRAMUSCULAR | Status: DC | PRN

## 2020-05-26 MED ORDER — ACETAMINOPHEN 500 MG PO TABS: 1000.0000 mg | ORAL_TABLET | Freq: Four times a day (QID) | ORAL | Status: DC | PRN

## 2020-05-26 MED ORDER — POTASSIUM PHOSPHATES 15 MMOLE/5ML IV SOLN
20.0000 mmol | Freq: Once | INTRAVENOUS | Status: AC
  Administered 2020-05-26: 20 mmol via INTRAVENOUS
  Filled 2020-05-26: qty 6.67

## 2020-05-26 MED ORDER — ENOXAPARIN SODIUM 100 MG/ML ~~LOC~~ SOLN
100.0000 mg | Freq: Two times a day (BID) | SUBCUTANEOUS | Status: DC
  Administered 2020-05-26 – 2020-05-28 (×5): 100 mg via SUBCUTANEOUS
  Filled 2020-05-26 (×5): qty 1

## 2020-05-26 MED ORDER — LORAZEPAM 2 MG/ML IJ SOLN: 2.0000 mg | Freq: Every evening | INTRAMUSCULAR | Status: DC | PRN

## 2020-05-26 MED ORDER — POTASSIUM CHLORIDE 10 MEQ/100ML IV SOLN
10.0000 meq | INTRAVENOUS | Status: AC
  Administered 2020-05-26 (×2): 10 meq via INTRAVENOUS
  Filled 2020-05-26 (×2): qty 100

## 2020-05-26 MED ORDER — OXYCODONE HCL 5 MG PO TABS
5.0000 mg | ORAL_TABLET | ORAL | Status: DC | PRN
Start: 2020-05-26 — End: 2020-05-28
  Administered 2020-05-28: 5 mg via ORAL
  Filled 2020-05-26: qty 1

## 2020-05-26 MED ORDER — POTASSIUM CHLORIDE 10 MEQ/100ML IV SOLN: 10.0000 meq | INTRAVENOUS | Status: DC

## 2020-05-26 NOTE — Progress Notes (Signed)
ANTICOAGULATION CONSULT NOTE - Initial Consult  Pharmacy Consult for enoxaparin Indication: Hx PE 2016   No Known Allergies  Patient Measurements: Height 5'11" Weight 104kg  Vital Signs: Temp: 99.1 F (37.3 C) (03/01 0441) Temp Source: Oral (03/01 0441) BP: 147/84 (03/01 0441) Pulse Rate: 86 (03/01 0441)  Labs: Recent Labs    05/23/20 1149 05/24/20 0212 05/25/20 0037 05/26/20 0056  HGB  --  14.9 14.1 14.2  HCT  --  43.6 41.5 41.0  PLT  --  152 158 150  APTT 33  --   --   --   LABPROT 15.3*  --   --   --   INR 1.3*  --   --   --   CREATININE  --  1.23 1.46* 1.00    CrCl cannot be calculated (Unknown ideal weight.).    Assessment: 75 yo M with hx PE on apixaban PTA, now held for SBO. Pharmacy consulted to start therapeutic enoxaparin. Last weight in chart was in 2021, patient doesn't know current weight. Nurse tech obtained weight of 104kg. Last received enoxaparin 40mg  2/28 at 5PM.   Plan: Start enoxaparin 100 mg Q12hr  Monitor for signs/symptoms of bleeding  F/u ability to restart apixaban   3/28, PharmD, BCPS, [facility] Clinical Pharmacist  Please check AMION for all [facility] phone numbers After 10:00 PM, call Main Pharmacy [phone]

## 2020-05-26 NOTE — Care Management Important Message (Signed)
Important Message  Patient Details  Name: Jesus [name] MRN: [id number] Date of Birth: 1946-02-13   Medicare Important Message Given:  Yes     Dorena Bodo 05/26/2020, 3:04 PM

## 2020-05-26 NOTE — Progress Notes (Addendum)
3 Days Post-Op  Subjective: CC: Patient reports his is much improved since yesterday this morning. Denies abdominal abdominal pain. Denies nausea, burping/belching. Did receive zofran last night. 1.15L out from NGT. Scant output this morning. Several episodes of flatus yesterday and continuing this morning. No BM, however feels like he may have one soon. Voiding without difficulty. He is mobilizing more and walked around the hall yesterday.   Objective: Vital signs in last 24 hours: Temp:  [97.9 F (36.6 C)-99.1 F (37.3 C)] 99.1 F (37.3 C) (03/01 0441) Pulse Rate:  [65-87] 86 (03/01 0441) Resp:  [16-19] 17 (03/01 0441) BP: (130-147)/(79-93) 147/84 (03/01 0441) SpO2:  [96 %-98 %] 98 % (03/01 0441) Last BM Date: 05/22/20  Intake/Output from previous day: 02/28 0701 - 03/01 0700 In: 1261.7 [I.V.:791.8; IV Piggyback:469.9] Out: 2125 [Urine:975; Emesis/NG output:1150] Intake/Output this shift: No intake/output data recorded.  PE: Gen:  Alert, NAD, pleasant HEENT: EOM's intact, pupils equal and round Card:  RRR Pulm:  CTAB, no W/R/R, effort normal Abd: Soft, mild distension (he reports this is his baseline), tenderness around the midline wound that appears appropriate. No peritonitis. Midline wound with honeycomb dressing that was removed. Midline staples c/d/i. More active bowel sounds  Ext:  No LE edema  Psych: A&Ox4 Skin: no rashes noted, warm and dry  Lab Results:  Recent Labs    05/25/20 0037 05/26/20 0056  WBC 5.2 6.9  HGB 14.1 14.2  HCT 41.5 41.0  PLT 158 150   BMET Recent Labs    05/25/20 0037 05/26/20 0056  NA 141 138  K 3.6 3.4*  CL 102 105  CO2 27 25  GLUCOSE 114* 101*  BUN 26* 15  CREATININE 1.46* 1.00  CALCIUM 8.9 8.9   PT/INR Recent Labs    05/23/20 1149  LABPROT 15.3*  INR 1.3*   CMP     Component Value Date/Time   NA 138 05/26/2020 0056   NA 141 06/18/2019 0945   K 3.4 (L) 05/26/2020 0056   CL 105 05/26/2020 0056   CO2 25  05/26/2020 0056   GLUCOSE 101 (H) 05/26/2020 0056   BUN 15 05/26/2020 0056   BUN 15 06/18/2019 0945   CREATININE 1.00 05/26/2020 0056   CALCIUM 8.9 05/26/2020 0056   PROT 7.8 05/23/2020 1116   PROT 6.7 06/18/2019 0945   ALBUMIN 4.5 05/23/2020 1116   ALBUMIN 4.2 06/18/2019 0945   AST 38 05/23/2020 1116   ALT 24 05/23/2020 1116   ALKPHOS 51 05/23/2020 1116   BILITOT 1.9 (H) 05/23/2020 1116   BILITOT 0.3 06/18/2019 0945   GFRNONAA >60 05/26/2020 0056   GFRAA 92 06/18/2019 0945   Lipase     Component Value Date/Time   LIPASE 30 05/23/2020 1116       Studies/Results: No results found.  Anti-infectives: Anti-infectives (From admission, onward)   Start     Dose/Rate Route Frequency Ordered Stop   05/24/20 0600  cefOXitin (MEFOXIN) 2 g in sodium chloride 0.9 % 100 mL IVPB        2 g 200 mL/hr over 30 Minutes Intravenous On call to O.R. 05/23/20 1432 05/24/20 1017       Assessment/Plan Hx HTN - home meds  Hx of PE (2016) - Will start therapeutic Lovenox while PO's on hold. Takes Eliquis at home  AKI - Resolved. Cr 1.00. Continue IVF until taking in PO's. BMP in AM.   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 Doylene Canard - POD#3 - Intraop findings: Internal hernia within the incarcerated ventral hernia causing a closed-loop obstruction with bowel which initially looked ischemic but after a few minutes appeared viable and peristaltic - Ileus appears to be resolving. Some ROBF but still >1L out from NGT in the last 24 hours and required zofran last night. Will do clamping trial Keep K>4 and Mg>2 (replace K and Phos). - OOB and mobilize. PT ordered (walks with cane at baseline) - IS/pulm toilet  FEN: NGT clamped. Sips/chips from the floor. PM check for possible NGT removal. IVF. Replace K and Phos. PM BMP and Phos recheck.  Will start bowel regimen when taking PO's ID: perioperative cefoxitin x1 dose. W 6.9 Foley: removed. Voiding VTE: Start Therapeutic Lovenox, SCDs  Dispo: med-surg, NGT clamping trials    LOS: 3 days    [name] , [facility] Surgery 05/26/2020, 7:45 AM Please see Amion for pager number during day hours 7:00am-4:30pm   I examined the patient and agree with the assessment and plan as documented in the note. NG output decreased overnight, patient is passing flatus. Clamping trial in progress, remove NG this afternoon if patient tolerates.  [name], MD [facility] Surgery General, Hepatobiliary and Pancreatic Surgery 05/26/20 12:02 PM

## 2020-05-26 NOTE — Evaluation (Addendum)
Physical Therapy Evaluation Patient Details Name: Jesus [name] MRN: [id number] DOB: Jul 23, 1945 Today's Date: 05/26/2020   History of Present Illness  75 y.o. male admitted on 05/23/20 for SBO s/p exploratory laparotomy with ventral hernia repair on 05/23/20.  Pt with significant PMH of previous hernia reapir (same spot),HTN, h/o PE.  Clinical Impression  Pt was able to walk a good distance down the hallway, using RW for support and balance.  He has mildly staggering gait pattern, compensated well with light hands on RW.  Will likely progress back to cane or no AD before he goes home.  We reviewed abdominal precautions, RW safety and freq of PT.  I encouraged ankle pumps for blood clot prevention/circulation throughout the day.  Daughter present at the end of the session.  PT to follow acutely for deficits listed below.      Follow Up Recommendations No PT follow up    Equipment Recommendations  None recommended by PT    Recommendations for Other Services       Precautions / Restrictions Precautions Precautions: Fall;Other (comment) Precaution Comments: abdominal Required Braces or Orthoses: Other Brace Other Brace: abdominal binder      Mobility  Bed Mobility Overal bed mobility: Needs Assistance Bed Mobility: Rolling;Sidelying to Sit Rolling: Min guard Sidelying to sit: Min guard       General bed mobility comments: Educated on log roll technique, pt with heavy use of rails, HOB elevated.  Will need to progress to flat bed no rails.  He comes out of the left side of the bed at home which is what we practiced today.    Transfers Overall transfer level: Needs assistance Equipment used: Rolling walker (2 wheeled) Transfers: Sit to/from Stand Sit to Stand: Min guard         General transfer comment: min guard assist for safety  Ambulation/Gait Ambulation/Gait assistance: Min guard Gait Distance (Feet): 130 Feet Assistive device: Rolling walker (2 wheeled) Gait  Pattern/deviations: Step-through pattern;Staggering left;Staggering right     General Gait Details: pt with mildly staggering gait pattern, cues for upright posture and safe RW use (keep all 4 points in contact with the floor).  Stairs            Wheelchair Mobility    Modified Rankin (Stroke Patients Only)       Balance Overall balance assessment: Mild deficits observed, not formally tested                                           Pertinent Vitals/Pain Pain Assessment: Faces Faces Pain Scale: Hurts even more Pain Location: incisional Pain Descriptors / Indicators: Grimacing;Guarding Pain Intervention(s): Limited activity within patient's tolerance;Monitored during session;Repositioned;Other (comment) (educated re: bracing with pillow to cough or sneeze)    Home Living Family/patient expects to be discharged to:: Private residence Living Arrangements: Spouse/significant other (wife is retired, dtr down from [place] to help) Available Help at Discharge: Family;Available 24 hours/day Type of Home: House Home Access: Stairs to enter Entrance Stairs-Rails: Left Entrance Stairs-Number of Steps: 3 Home Layout: One level Home Equipment: Walker - 2 wheels;Walker - 4 wheels;Cane - single point;Grab bars - tub/shower      Prior Function Level of Independence: Independent with assistive device(s)         Comments: pt reports since he got sick he was using a cane because he felt weak from not eating.  Hand Dominance   Dominant Hand: Right    Extremity/Trunk Assessment   Upper Extremity Assessment Upper Extremity Assessment: Overall WFL for tasks assessed    Lower Extremity Assessment Lower Extremity Assessment: Overall WFL for tasks assessed    Cervical / Trunk Assessment Cervical / Trunk Assessment: Normal  Communication   Communication: No difficulties  Cognition Arousal/Alertness: Awake/alert Behavior During Therapy: WFL for tasks  assessed/performed Overall Cognitive Status: Within Functional Limits for tasks assessed                                        General Comments General comments (skin integrity, edema, etc.): No reports of lightheadedness or dizziness.    Exercises     Assessment/Plan    PT Assessment Patient needs continued PT services  PT Problem List Decreased activity tolerance;Decreased balance;Decreased mobility;Decreased knowledge of use of DME;Decreased knowledge of precautions;Pain       PT Treatment Interventions DME instruction;Gait training;Stair training;Functional mobility training;Therapeutic exercise;Therapeutic activities;Balance training;Patient/family education    PT Goals (Current goals can be found in the Care Plan section)  Acute Rehab PT Goals Patient Stated Goal: to go home as soon as they will let him PT Goal Formulation: With patient Time For Goal Achievement: 06/09/20 Potential to Achieve Goals: Good    Frequency Min 3X/week   Barriers to discharge        Co-evaluation               AM-PAC PT "6 Clicks" Mobility  Outcome Measure Help needed turning from your back to your side while in a flat bed without using bedrails?: A Little Help needed moving from lying on your back to sitting on the side of a flat bed without using bedrails?: A Little Help needed moving to and from a bed to a chair (including a wheelchair)?: A Little Help needed standing up from a chair using your arms (e.g., wheelchair or bedside chair)?: A Little Help needed to walk in hospital room?: A Little Help needed climbing 3-5 steps with a railing? : A Little 6 Click Score: 18    End of Session   Activity Tolerance: Patient limited by pain Patient left: in chair;with call bell/phone within reach;with chair alarm set;with family/visitor present (daughter present)   PT Visit Diagnosis: Muscle weakness (generalized) (M62.81);Difficulty in walking, not elsewhere classified  (R26.2);Pain Pain - Right/Left:  (midline) Pain - part of body:  (incisional/abdomen)    Time: 7893-8101 PT Time Calculation (min) (ACUTE ONLY): 29 min   Charges:   PT Evaluation $PT Eval Moderate Complexity: 1 Mod PT Treatments $Gait Training: 8-22 mins        Corinna Capra, PT, DPT  Acute Rehabilitation [phone] pager [phone]) [phone] office

## 2020-05-27 LAB — BASIC METABOLIC PANEL
Anion gap: 8 (ref 5–15)
BUN: 10 mg/dL (ref 8–23)
CO2: 23 mmol/L (ref 22–32)
Calcium: 8.7 mg/dL — ABNORMAL LOW (ref 8.9–10.3)
Chloride: 106 mmol/L (ref 98–111)
Creatinine, Ser: 0.95 mg/dL (ref 0.61–1.24)
GFR, Estimated: >60 mL/min (ref >60)
Glucose, Bld: 79 mg/dL (ref 70–99)
Potassium: 4 mmol/L (ref 3.5–5.1)
Sodium: 137 mmol/L (ref 135–145)

## 2020-05-27 LAB — CBC
HCT: 40.2 % (ref 39.0–52.0)
Hemoglobin: 13.4 g/dL (ref 13.0–17.0)
MCH: 29.5 pg (ref 26.0–34.0)
MCHC: 33.3 g/dL (ref 30.0–36.0)
MCV: 88.5 fL (ref 80.0–100.0)
Platelets: 168 10*3/uL (ref 150–400)
RBC: 4.54 MIL/uL (ref 4.22–5.81)
RDW: 13.4 % (ref 11.5–15.5)
WBC: 8.4 10*3/uL (ref 4.0–10.5)
nRBC: 0 % (ref 0.0–0.2)

## 2020-05-27 LAB — PHOSPHORUS: Phosphorus: 1.3 mg/dL — ABNORMAL LOW (ref 2.5–4.6)

## 2020-05-27 MED ORDER — DOCUSATE SODIUM 100 MG PO CAPS
100.0000 mg | ORAL_CAPSULE | Freq: Two times a day (BID) | ORAL | Status: DC
  Administered 2020-05-27 – 2020-05-28 (×2): 100 mg via ORAL
  Filled 2020-05-27 (×3): qty 1

## 2020-05-27 MED ORDER — HYDROMORPHONE HCL 1 MG/ML IJ SOLN: 0.5000 mg | INTRAMUSCULAR | Status: DC | PRN

## 2020-05-27 MED ORDER — METHOCARBAMOL 500 MG PO TABS
500.0000 mg | ORAL_TABLET | Freq: Four times a day (QID) | ORAL | Status: DC | PRN
  Administered 2020-05-27 – 2020-05-28 (×2): 500 mg via ORAL
  Filled 2020-05-27 (×2): qty 1

## 2020-05-27 MED ORDER — LORAZEPAM 1 MG PO TABS: 2.0000 mg | ORAL_TABLET | Freq: Every evening | ORAL | Status: DC | PRN

## 2020-05-27 MED ORDER — POTASSIUM PHOSPHATES 15 MMOLE/5ML IV SOLN
30.0000 mmol | Freq: Once | INTRAVENOUS | Status: AC
  Administered 2020-05-27: 30 mmol via INTRAVENOUS
  Filled 2020-05-27: qty 10

## 2020-05-27 NOTE — Progress Notes (Addendum)
4 Days Post-Op  Subjective: CC: Patient reports his is much improved today. Denies abdominal abdominal pain, burping, and belching. Denies nausea and vomiting. Has had several BMs since yesterday. Tolerating clear liquid diet since initiation last night without difficulty, nausea, or vomiting. Voiding without difficulty. He is mobilizing more and walked with PT yesterday.   Objective: Vital signs in last 24 hours: Temp:  [98 F (36.7 C)-99.4 F (37.4 C)] 99.4 F (37.4 C) (03/02 0506) Pulse Rate:  [82-87] 87 (03/02 0506) Resp:  [17-20] 20 (03/02 0506) BP: (146-169)/(89-94) 146/94 (03/02 0506) SpO2:  [98 %-100 %] 99 % (03/02 0506) Weight:  [104.6 kg] 104.6 kg (03/01 1052) Last BM Date: 05/26/20  Intake/Output from previous day: 03/01 0701 - 03/02 0700 In: 5068 [I.V.:4514.3; IV Piggyback:553.7] Out: 1300 [Urine:1000; Emesis/NG output:300] Intake/Output this shift: No intake/output data recorded.  PE: Gen:  Alert, NAD, pleasant HEENT: EOM's intact, pupils equal and round Card:  RRR Pulm:  CTAB, no W/R/R, effort normal Abd: Soft, mild distension, tenderness around the midline wound that appears appropriate. Appears to have hematoma lateral to midline wound that is firm to touch. No evidence of cellulitis. Will monitor. Midline wound w/ staples below that appear c/d/i. Normoactive bowel sounds  Ext:  No LE edema  Psych: A&Ox4 Skin: no rashes noted, warm and dry  Lab Results:  Recent Labs    05/26/20 0056 05/27/20 0230  WBC 6.9 8.4  HGB 14.2 13.4  HCT 41.0 40.2  PLT 150 168   BMET Recent Labs    05/26/20 1245 05/27/20 0230  NA 138 137  K 3.9 4.0  CL 106 106  CO2 22 23  GLUCOSE 87 79  BUN 13 10  CREATININE 0.93 0.95  CALCIUM 8.8* 8.7*   PT/INR No results for input(s): LABPROT, INR in the last 72 hours. CMP     Component Value Date/Time   NA 137 05/27/2020 0230   NA 141 06/18/2019 0945   K 4.0 05/27/2020 0230   CL 106 05/27/2020 0230   CO2 23  05/27/2020 0230   GLUCOSE 79 05/27/2020 0230   BUN 10 05/27/2020 0230   BUN 15 06/18/2019 0945   CREATININE 0.95 05/27/2020 0230   CALCIUM 8.7 (L) 05/27/2020 0230   PROT 7.8 05/23/2020 1116   PROT 6.7 06/18/2019 0945   ALBUMIN 4.5 05/23/2020 1116   ALBUMIN 4.2 06/18/2019 0945   AST 38 05/23/2020 1116   ALT 24 05/23/2020 1116   ALKPHOS 51 05/23/2020 1116   BILITOT 1.9 (H) 05/23/2020 1116   BILITOT 0.3 06/18/2019 0945   GFRNONAA >60 05/27/2020 0230   GFRAA 92 06/18/2019 0945   Lipase     Component Value Date/Time   LIPASE 30 05/23/2020 1116       Studies/Results: No results found.  Anti-infectives: Anti-infectives (From admission, onward)   Start     Dose/Rate Route Frequency Ordered Stop   05/24/20 0600  cefOXitin (MEFOXIN) 2 g in sodium chloride 0.9 % 100 mL IVPB        2 g 200 mL/hr over 30 Minutes Intravenous On call to O.R. 05/23/20 1432 05/24/20 1017       Assessment/Plan Hx HTN - home meds  Hx of PE (2016) - anticoagulation with Enoxaparin (Lovenox) injection SQ. Hgb stable.  Elevated Cr - Cr trending down 1.46 (on 2/28)- > 0.95 today. Continue to monitor and trend on BMP AM.   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 Doylene Canard - POD#4 - Intraop findings: Same, internal hernia within the incarcerated ventral hernia causing a closed-loop obstruction with bowel which initially looked ischemic but after a few minutes appeared viable and peristaltic. - Ileus resolving. Adv diet   - OOB and mobilize. Cleared by PT - IS/pulm toilet - Small hematoma next to midline wound. No evidence of infection. Will monitor to ensure patient does not need some staples removed.   FEN: D/c IVF, Full liquid diet this AM, and progress to soft solids if tolerating well this PM. ID: perioperative cefoxitin x1 dose. WBC 8.4.   Foley: removed. Voiding. Home Flomax  VTE: Lovenox, SCDs, anticoagulation as above.  Dispo: med-surg, plan as above.    LOS: 4 days    Jesus [name] , [facility] Surgery 05/27/2020, 8:16 AM Please see Amion for pager number during day hours 7:00am-4:30pm   I examined the patient and agree with the assessment and plan as documented in the note. Having bowel movements, if tolerating clears today will advance to soft diet later. Small periumbilical hematoma, no signs of cellulitis, will continue to monitor.  Jesus Simas, MD [facility] Surgery General, Hepatobiliary and Pancreatic Surgery 05/27/20 9:19 AM

## 2020-05-27 NOTE — Progress Notes (Addendum)
PT Cancellation Note  Patient Details Name: Jesus [name] MRN: [id number] DOB: 1946/01/29   Cancelled Treatment:    Reason Eval/Treat Not Completed: (P) Patient declined, no reason specified PTA provided education and encouragement and he continues to decline.  RN attempted to educated and encourage patient and he continues to decline.  Will f/u per POC.    Kiven Vangilder Artis Delay 05/27/2020, 4:54 PM  Bonney Leitz , PTA Acute Rehabilitation Services Pager [phone] Office [phone]

## 2020-05-28 ENCOUNTER — Other Ambulatory Visit: Payer: Medicare Other

## 2020-05-28 MED ORDER — METHOCARBAMOL 500 MG PO TABS
500.0000 mg | ORAL_TABLET | Freq: Four times a day (QID) | ORAL | 0 refills | Status: AC | PRN
Start: 2020-05-28 — End: ?

## 2020-05-28 MED ORDER — HYDRALAZINE HCL 20 MG/ML IJ SOLN: 10.0000 mg | Freq: Three times a day (TID) | INTRAMUSCULAR | Status: DC | PRN

## 2020-05-28 MED ORDER — ACETAMINOPHEN 500 MG PO TABS
1000.0000 mg | ORAL_TABLET | Freq: Three times a day (TID) | ORAL | 0 refills | Status: AC | PRN
Start: 2020-05-28 — End: 2020-06-02

## 2020-05-28 MED ORDER — METOPROLOL TARTRATE 5 MG/5ML IV SOLN: 5.0000 mg | Freq: Four times a day (QID) | INTRAVENOUS | Status: DC | PRN

## 2020-05-28 MED ORDER — DOCUSATE SODIUM 100 MG PO CAPS: 100.0000 mg | ORAL_CAPSULE | Freq: Two times a day (BID) | ORAL | 0 refills | Status: DC

## 2020-05-28 MED ORDER — CEPHALEXIN 500 MG PO CAPS
500.0000 mg | ORAL_CAPSULE | Freq: Four times a day (QID) | ORAL | 0 refills | Status: AC
Start: 2020-05-28 — End: ?

## 2020-05-28 NOTE — Progress Notes (Signed)
PT Cancellation Note  Patient Details Name: Jesus [name] MRN: [id number] DOB: Jan 16, 1946   Cancelled Treatment:    Reason Eval/Treat Not Completed: (P) Patient declined, no reason specified (Pt continues to decline.  PTA entered room and patient immediately agitated and reports. "I'm discharged! My doctor told me I didn't have to do PT.  It would be best if you just left for the both of [place]."  PTA unable to educate this session due to behavior and agitation.  He has not mobilized in two days despite efforts. )   Aimee Artis Delay 05/28/2020, 10:13 AM  Bonney Leitz , PTA Acute Rehabilitation Services Pager [phone] Office [phone]

## 2020-05-28 NOTE — Discharge Instructions (Signed)
[facility]      [facility] [phone]  OPEN ABDOMINAL SURGERY: POST OP INSTRUCTIONS  Always review your discharge instruction sheet given to you by the facility where your surgery was performed.  IF YOU HAVE DISABILITY OR FAMILY LEAVE FORMS, YOU MUST BRING THEM TO THE OFFICE FOR PROCESSING.  PLEASE DO NOT GIVE THEM TO YOUR DOCTOR.  1. A prescription for pain medication may be given to you upon discharge.  Take your pain medication as prescribed, if needed.  If narcotic pain medicine is not needed, then you may take acetaminophen (Tylenol) or ibuprofen (Advil) as needed. 2. Take your usually prescribed medications unless otherwise directed. 3. If you need a refill on your pain medication, please contact your pharmacy. They will contact our office to request authorization.  Prescriptions will not be filled after 5pm or on week-ends. 4. You should follow a light diet the first few days after arrival home, such as soup and crackers, pudding, etc.unless your doctor has advised otherwise. A high-fiber, low fat diet can be resumed as tolerated.   Be sure to include lots of fluids daily. Most patients will experience some swelling and bruising on the chest and neck area.  Ice packs will help.  Swelling and bruising can take several days to resolve 5. Most patients will experience some swelling and bruising in the area of the incision. Ice pack will help. Swelling and bruising can take several days to resolve..  6. It is common to experience some constipation if taking pain medication after surgery.  Increasing fluid intake and taking a stool softener will usually help or prevent this problem from occurring.  A mild laxative (Milk of Magnesia or Miralax) should be taken according to package directions if there are no bowel movements after 48 hours. 7.  You may have steri-strips (small skin tapes) in place directly over the incision.  These strips should be left on the skin for 7-10 days.  If your  surgeon used skin glue on the incision, you may shower in 24 hours.  The glue will flake off over the next 2-3 weeks.  Any sutures or staples will be removed at the office during your follow-up visit. You may find that a light gauze bandage over your incision may keep your staples from being rubbed or pulled. You may shower and replace the bandage daily. 8. ACTIVITIES:  You may resume regular (light) daily activities beginning the next day--such as daily self-care, walking, climbing stairs--gradually increasing activities as tolerated.  You may have sexual intercourse when it is comfortable.  Refrain from any heavy lifting or straining until approved by your doctor. a. You may drive when you no longer are taking prescription pain medication, you can comfortably wear a seatbelt, and you can safely maneuver your car and apply brakes b. Return to Work: ___________________________________ 9. You should see your doctor in the office for a follow-up appointment approximately two weeks after your surgery.  Make sure that you call for this appointment within a day or two after you arrive home to insure a convenient appointment time. OTHER INSTRUCTIONS:  _____________________________________________________________ _____________________________________________________________  WHEN TO CALL YOUR DOCTOR: 1. Fever over 101.0 2. Inability to urinate 3. Nausea and/or vomiting 4. Extreme swelling or bruising 5. Continued bleeding from incision. 6. Increased pain, redness, or drainage from the incision. 7. Difficulty swallowing or breathing 8. Muscle cramping or spasms. 9. Numbness or tingling in hands or feet or around lips.  The clinic staff is available to   answer your questions during regular business hours.  Please don't hesitate to call and ask to speak to one of the nurses if you have concerns.  For further questions, please visit [email or web address]   

## 2020-05-28 NOTE — Progress Notes (Signed)
Jesus [name] to be D/C'd  per MD order. Discussed with the patient and all questions fully answered.  VSS, Skin clean, dry and intact without evidence of skin break down, no evidence of skin tears noted.  IV catheter discontinued intact. Site without signs and symptoms of complications. Dressing and pressure applied.  An After Visit Summary was printed and given to the patient.  D/c education completed with patient/family including follow up instructions, medication list, d/c activities limitations if indicated, with other d/c instructions as indicated by MD - patient able to verbalize understanding, all questions fully answered.   Patient instructed to return to ED, call 911, or call MD for any changes in condition.   Patient to be escorted via WC, and D/C home via private auto.

## 2020-05-28 NOTE — Plan of Care (Signed)
  Problem: Activity: Goal: Risk for activity intolerance will decrease Outcome: Progressing   Problem: Coping: Goal: Level of anxiety will decrease Outcome: Progressing   Problem: Elimination: Goal: Will not experience complications related to bowel motility Outcome: Progressing   Problem: Pain Managment: Goal: General experience of comfort will improve Outcome: Progressing   

## 2020-05-28 NOTE — Discharge Summary (Signed)
Patient ID: Jesus [name] [id number] Feb 23, 1946 75 y.o.  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  The patient had a hernia repair around 20 years ago in which he states that no mesh was placed.  He has subsequently developed a recurrent hernia that has always easily reduced.  He normally doesn't have any issues from this.  However 3 days ago, he developed a bulge that he could not get to reduce. This was accompanied by pain, nausea, and vomiting.  He hasn't passed gas in several days.  He denies fever/chills.  He states that he is uncomfortable, but not in severe pain.  He hasn't had relief laying on his side or over the counter pain meds.    Of note, he is on eliquis for h/o PE in 2016.  He hasn't taken it today due to n/v.  He also has significant HTN.  Procedures Dr. Fredricka Bonine - 05/23/20 - exploratorylaparotomy, lysis of adhesions x45 minutes,primary repair of incarcerated recurrent ventral incisional hernia    Hospital Course:  Pateint presented as above and was admitted to the general surgery service. Patient was taken to the OR on 2/26 by Dr. Fredricka Bonine and underwent exploratorylaparotomy, lysis of adhesions x45 minutes,primary repair of incarcerated recurrent ventral incisional hernia. Intraop was noted to have internal hernia within the incarcerated ventral hernia causing a closed-loop obstruction withbowel which initially looked ischemic but  after a few minutes appeared viable and peristaltic. Patient tolerated the procedure well and was transferred to the floor. Patient developed ileus post op. After a few days, ileus resolved and diet was advanced and tolerated. Patient was started on therapeutic Lovenox to cover for hx of prior PE while inpatient. He developed a hematoma around midline wound as noted below. On 3/3 he was noted to have some faint blanchable erythema at the base of the wound. Discussed with attending who recommended keeping staples in place and prescribing 5 days of oral keflex with wound check next week in office. On POD 5, the patient was voiding well, tolerating diet, ambulating well (cleared by PT with no follow up or equipment recommended), pain well controlled, vital signs stable, incisions c/d/i and felt stable for discharge home. Eliquis to be restarted at discharge. Follow up as noted below. Return precautions discussed with patient. Patient was reviewed in the PDMP. He received 120 oxycodone 10mg  hcl tablets on 05/21/20. No narcotic was prescribed at d/c and patient stated understanding of this.   Physical Exam: Gen:  Alert, NAD, pleasant HEENT: EOM's intact, pupils equal and round Card:  RRR Pulm:  CTAB, no W/R/R, effort normal Abd: Soft, mild distension, tenderness around the midline wound that appears appropriate. Hematoma lateral to midline wound that is firm to touch as noted below. There is faint erythema at the base that is blanchable. See picture below. No drainage. Midline wound w/ staples below that appear c/d/i. Normoactive bowel sounds  Ext:  No LE edema  Psych: A&Ox4 Skin: no rashes noted, warm and dry     Allergies as of  05/28/2020   No Known Allergies     Medication List    TAKE these medications   acetaminophen 500 MG tablet Commonly known as: TYLENOL Take 2 tablets (1,000 mg total) by mouth every 8 (eight) hours as needed for up to 5 days.   albuterol 108 (90 Base) MCG/ACT  inhaler Commonly known as: VENTOLIN HFA Inhale 1-2 puffs into the lungs as needed for wheezing or shortness of breath.   aliskiren 150 MG tablet Commonly known as: TEKTURNA Take 1 tablet (150 mg total) by mouth every morning.   apixaban 5 MG Tabs tablet Commonly known as: Eliquis Please take  2 tablets (10 mg) oral two times  daily for 6 days (until 09/26/2014), then take 1 tablet (5 mg) oral two times daily thereafter.   Bystolic 20 MG Tabs Generic drug: Nebivolol HCl Take 1 tablet (20 mg total) by mouth at bedtime. What changed:   how much to take  when to take this   cephALEXin 500 MG capsule Commonly known as: KEFLEX Take 1 capsule (500 mg total) by mouth 4 (four) times daily.   chlorthalidone 25 MG tablet Commonly known as: HYGROTON Take 0.5 tablets (12.5 mg total) by mouth daily as needed.   cloNIDine 0.2 MG tablet Commonly known as: CATAPRES Take 0.1 mg by mouth as needed (blood pressure).   D3-1000 PO Take 1 tablet by mouth daily.   docusate sodium 100 MG capsule Commonly known as: COLACE Take 1 capsule (100 mg total) by mouth 2 (two) times daily.   LORazepam 2 MG tablet Commonly known as: ATIVAN Take 2 mg by mouth at bedtime.   methocarbamol 500 MG tablet Commonly known as: ROBAXIN Take 1 tablet (500 mg total) by mouth every 6 (six) hours as needed for muscle spasms.   multivitamin with minerals Tabs tablet Take 1 tablet by mouth daily.   tamsulosin 0.4 MG Caps capsule Commonly known as: FLOMAX Take 0.4 mg by mouth daily.         Follow-up Information    [facility], [place]. Go on 06/03/2020.   Specialty: General Surgery Why: 230pm. This is for a wound check and possible staple removal. Please bring a copy of your photo ID and insurance card. Please arrive 30 minutes early for paperwork.  Contact information: [address] [place] [address] [phone]       Berna Bue, MD. Go on 06/17/2020.    Specialty: General Surgery Why: 230pm. This is for a wound check and possible staple removal. Please bring a copy of your photo ID and insurance card. Please arrive 30 minutes early for paperwork.  Contact information: [address] [place] [address] [phone]        Leilani Able, MD Follow up.   Specialty: Family Medicine Why: For your hospital follow up and intermittent elevated blood pressures during admission.  Contact information: [address] [place] [address] [phone]               Signed: Leary Roca, [facility] 05/28/2020, 8:53 AM Please see Amion for pager number during day hours 7:00am-4:30pm

## 2020-09-24 ENCOUNTER — Other Ambulatory Visit (HOSPITAL_BASED_OUTPATIENT_CLINIC_OR_DEPARTMENT_OTHER): Payer: Self-pay

## 2020-09-24 DIAGNOSIS — R0683 Snoring: Secondary | ICD-10-CM

## 2020-09-24 DIAGNOSIS — R5383 Other fatigue: Secondary | ICD-10-CM

## 2020-09-24 DIAGNOSIS — G4700 Insomnia, unspecified: Secondary | ICD-10-CM

## 2020-09-24 DIAGNOSIS — R0681 Apnea, not elsewhere classified: Secondary | ICD-10-CM

## 2020-11-27 ENCOUNTER — Encounter (HOSPITAL_BASED_OUTPATIENT_CLINIC_OR_DEPARTMENT_OTHER): Payer: PRIVATE HEALTH INSURANCE | Admitting: Internal Medicine

## 2020-12-08 ENCOUNTER — Encounter (HOSPITAL_BASED_OUTPATIENT_CLINIC_OR_DEPARTMENT_OTHER): Payer: PRIVATE HEALTH INSURANCE | Admitting: Internal Medicine

## 2021-04-07 ENCOUNTER — Other Ambulatory Visit: Payer: Self-pay

## 2021-04-07 ENCOUNTER — Ambulatory Visit (HOSPITAL_BASED_OUTPATIENT_CLINIC_OR_DEPARTMENT_OTHER): Payer: Medicare Other | Attending: Family Medicine | Admitting: Internal Medicine

## 2021-04-07 DIAGNOSIS — G4700 Insomnia, unspecified: Secondary | ICD-10-CM | POA: Diagnosis not present

## 2021-04-07 DIAGNOSIS — R5383 Other fatigue: Secondary | ICD-10-CM | POA: Diagnosis not present

## 2021-04-07 DIAGNOSIS — R0681 Apnea, not elsewhere classified: Secondary | ICD-10-CM

## 2021-04-07 DIAGNOSIS — R0683 Snoring: Secondary | ICD-10-CM | POA: Diagnosis not present

## 2021-04-07 DIAGNOSIS — G4733 Obstructive sleep apnea (adult) (pediatric): Secondary | ICD-10-CM | POA: Insufficient documentation

## 2021-04-11 DIAGNOSIS — R0683 Snoring: Secondary | ICD-10-CM | POA: Diagnosis not present

## 2021-04-11 NOTE — Procedures (Signed)
° ° ° °  Patient Name: Jesus [name], Jesus [name] Date: 04/07/2021 Gender: Male D.O.B: April 26, 1945 Age (years): 54 Referring Provider: Lin Landsman Height (inches): 71 Interpreting Physician: Baird Lyons MD, ABSM Weight (lbs): 231 RPSGT: Zadie Rhine BMI: 32 MRN: [id number]  CLINICAL INFORMATION Sleep Study Type: NPSG Indication for sleep study: Fatigue, Snoring, Witnessed Apneas Epworth Sleepiness Score: 4  SLEEP STUDY TECHNIQUE As per the AASM Manual for the Scoring of Sleep and Associated Events v2.3 (April 2016) with a hypopnea requiring 4% desaturations.  The channels recorded and monitored were frontal, central and occipital EEG, electrooculogram (EOG), submentalis EMG (chin), nasal and oral airflow, thoracic and abdominal wall motion, anterior tibialis EMG, snore microphone, electrocardiogram, and pulse oximetry.  MEDICATIONS Medications self-administered by patient taken the night of the study : none reported  SLEEP ARCHITECTURE The study was initiated at 10:22:35 PM and ended at 5:12:35 AM.  Sleep onset time was 4.5 minutes and the sleep efficiency was 72.9%%. The total sleep time was 299 minutes.  Stage REM latency was 125.0 minutes.  The patient spent 7.7%% of the night in stage N1 sleep, 75.3%% in stage N2 sleep, 0.0%% in stage N3 and 17.1% in REM.  Alpha intrusion was absent.  Supine sleep was 2.51%.  RESPIRATORY PARAMETERS The overall apnea/hypopnea index (AHI) was 0.0 per hour. There were 0 total apneas, including 0 obstructive, 0 central and 0 mixed apneas. There were 0 hypopneas and 4 RERAs.  The AHI during Stage REM sleep was 0.0 per hour.  AHI while supine was 0.0 per hour.  The mean oxygen saturation was 91.4%. The minimum SpO2 during sleep was 86.0%.  soft snoring was noted during this study.  CARDIAC DATA The 2 lead EKG demonstrated sinus rhythm. The mean heart rate was 71.1 beats per minute. Other EKG findings include: PACs.  LEG MOVEMENT DATA The  total PLMS were 0 with a resulting PLMS index of 0.0. Associated arousal with leg movement index was 0.0 .  IMPRESSIONS - No significant obstructive sleep apnea occurred during this study (AHI = 0.0/h). - Mild oxygen desaturation was noted during this study (Min O2 = 86.0%). Mean 91.5%. - The patient snored with soft snoring volume. - EKG showed frequent PACs. - Clinically significant periodic limb movements did not occur during sleep. No significant associated arousals.  DIAGNOSIS - Primary Snoring  RECOMMENDATIONS - Manage for symptoms based on clinical judgment. - Sleep hygiene should be reviewed to assess factors that may improve sleep quality. - Weight management and regular exercise should be initiated or continued if appropriate.  [Electronically signed] 04/11/2021 11:30 AM  Baird Lyons MD, ABSM Diplomate, American Board of Sleep Medicine   NPI: [id number]                        [place]  ELECTRONICALLY SIGNED ON:  04/11/2021, 11:27 AM [place] PH: [phone]   FX: [phone] [place]

## 2021-08-13 ENCOUNTER — Other Ambulatory Visit (HOSPITAL_BASED_OUTPATIENT_CLINIC_OR_DEPARTMENT_OTHER): Payer: Self-pay

## 2021-09-13 ENCOUNTER — Ambulatory Visit: Payer: Self-pay | Admitting: Physician Assistant

## 2022-02-24 ENCOUNTER — Other Ambulatory Visit (HOSPITAL_BASED_OUTPATIENT_CLINIC_OR_DEPARTMENT_OTHER): Payer: Self-pay

## 2022-02-24 MED ORDER — OXYCODONE HCL 10 MG PO TABS
10.0000 mg | ORAL_TABLET | Freq: Four times a day (QID) | ORAL | 0 refills | Status: DC
  Filled 2022-02-24: qty 120, 30d supply, fill #0

## 2022-02-25 ENCOUNTER — Other Ambulatory Visit (HOSPITAL_BASED_OUTPATIENT_CLINIC_OR_DEPARTMENT_OTHER): Payer: Self-pay

## 2022-02-25 MED ORDER — APIXABAN 5 MG PO TABS
5.0000 mg | ORAL_TABLET | Freq: Two times a day (BID) | ORAL | 3 refills | Status: DC
  Filled 2022-02-25: qty 60, 30d supply, fill #0

## 2022-03-08 ENCOUNTER — Other Ambulatory Visit: Payer: Self-pay

## 2022-03-08 ENCOUNTER — Other Ambulatory Visit (HOSPITAL_BASED_OUTPATIENT_CLINIC_OR_DEPARTMENT_OTHER): Payer: Self-pay

## 2022-03-08 MED ORDER — ALBUTEROL SULFATE HFA 108 (90 BASE) MCG/ACT IN AERS: 1.0000 | INHALATION_SPRAY | RESPIRATORY_TRACT | 3 refills | Status: DC

## 2022-03-10 ENCOUNTER — Other Ambulatory Visit (HOSPITAL_BASED_OUTPATIENT_CLINIC_OR_DEPARTMENT_OTHER): Payer: Self-pay

## 2022-03-11 ENCOUNTER — Other Ambulatory Visit (HOSPITAL_BASED_OUTPATIENT_CLINIC_OR_DEPARTMENT_OTHER): Payer: Self-pay

## 2022-03-11 MED ORDER — NEBIVOLOL HCL 20 MG PO TABS
20.0000 mg | ORAL_TABLET | Freq: Every day | ORAL | 1 refills | Status: DC
  Filled 2022-03-11 – 2022-04-04 (×3): qty 90, 90d supply, fill #0
  Filled 2022-08-28: qty 90, 90d supply, fill #1

## 2022-03-11 MED ORDER — TAMSULOSIN HCL 0.4 MG PO CAPS
0.4000 mg | ORAL_CAPSULE | Freq: Every day | ORAL | 1 refills | Status: DC
  Filled 2022-03-11 – 2022-04-04 (×2): qty 90, 90d supply, fill #0
  Filled 2022-07-20: qty 90, 90d supply, fill #1

## 2022-03-11 MED ORDER — FLUAD QUADRIVALENT 0.5 ML IM PRSY
PREFILLED_SYRINGE | INTRAMUSCULAR | 0 refills | Status: AC
  Filled 2022-03-11: qty 0.5, 1d supply, fill #0

## 2022-03-11 MED ORDER — ALISKIREN FUMARATE 150 MG PO TABS
150.0000 mg | ORAL_TABLET | Freq: Every day | ORAL | 1 refills | Status: DC
  Filled 2022-03-11 – 2022-04-04 (×3): qty 90, 90d supply, fill #0
  Filled 2022-10-13: qty 90, 90d supply, fill #1
  Filled ????-??-??: fill #0

## 2022-03-11 MED ORDER — ELIQUIS 5 MG PO TABS
5.0000 mg | ORAL_TABLET | Freq: Two times a day (BID) | ORAL | 4 refills | Status: DC
  Filled 2022-03-11 – 2022-03-22 (×2): qty 60, 30d supply, fill #0
  Filled 2022-04-18 – 2022-09-27 (×2): qty 60, 30d supply, fill #1
  Filled 2022-11-16: qty 60, 30d supply, fill #2
  Filled 2023-02-14: qty 60, 30d supply, fill #3

## 2022-03-11 MED ORDER — ALBUTEROL SULFATE HFA 108 (90 BASE) MCG/ACT IN AERS
1.0000 | INHALATION_SPRAY | RESPIRATORY_TRACT | 3 refills | Status: DC
  Filled 2022-03-11: qty 6.7, 17d supply, fill #0

## 2022-03-14 ENCOUNTER — Other Ambulatory Visit (HOSPITAL_BASED_OUTPATIENT_CLINIC_OR_DEPARTMENT_OTHER): Payer: Self-pay

## 2022-03-16 ENCOUNTER — Other Ambulatory Visit (HOSPITAL_BASED_OUTPATIENT_CLINIC_OR_DEPARTMENT_OTHER): Payer: Self-pay

## 2022-03-16 MED ORDER — OXYCODONE HCL 10 MG PO TABS
10.0000 mg | ORAL_TABLET | Freq: Four times a day (QID) | ORAL | 0 refills | Status: DC
  Filled 2022-03-24 – 2022-04-16 (×2): qty 120, 30d supply, fill #0

## 2022-03-22 ENCOUNTER — Other Ambulatory Visit (HOSPITAL_BASED_OUTPATIENT_CLINIC_OR_DEPARTMENT_OTHER): Payer: Self-pay

## 2022-03-24 ENCOUNTER — Other Ambulatory Visit: Payer: Self-pay

## 2022-03-29 ENCOUNTER — Other Ambulatory Visit (HOSPITAL_BASED_OUTPATIENT_CLINIC_OR_DEPARTMENT_OTHER): Payer: Self-pay

## 2022-03-30 ENCOUNTER — Other Ambulatory Visit (HOSPITAL_BASED_OUTPATIENT_CLINIC_OR_DEPARTMENT_OTHER): Payer: Self-pay

## 2022-04-04 ENCOUNTER — Other Ambulatory Visit: Payer: Self-pay

## 2022-04-04 ENCOUNTER — Other Ambulatory Visit (HOSPITAL_BASED_OUTPATIENT_CLINIC_OR_DEPARTMENT_OTHER): Payer: Self-pay

## 2022-04-16 ENCOUNTER — Other Ambulatory Visit (HOSPITAL_COMMUNITY): Payer: Self-pay

## 2022-04-16 ENCOUNTER — Other Ambulatory Visit (HOSPITAL_BASED_OUTPATIENT_CLINIC_OR_DEPARTMENT_OTHER): Payer: Self-pay

## 2022-04-18 ENCOUNTER — Other Ambulatory Visit (HOSPITAL_BASED_OUTPATIENT_CLINIC_OR_DEPARTMENT_OTHER): Payer: Self-pay

## 2022-04-27 ENCOUNTER — Other Ambulatory Visit: Payer: Self-pay

## 2022-04-27 ENCOUNTER — Other Ambulatory Visit (HOSPITAL_BASED_OUTPATIENT_CLINIC_OR_DEPARTMENT_OTHER): Payer: Self-pay

## 2022-04-27 MED ORDER — OXYCODONE HCL 10 MG PO TABS
10.0000 mg | ORAL_TABLET | Freq: Four times a day (QID) | ORAL | 0 refills | Status: DC
  Filled 2022-05-16: qty 120, 30d supply, fill #0

## 2022-04-27 MED ORDER — OLMESARTAN MEDOXOMIL-HCTZ 40-12.5 MG PO TABS
1.0000 | ORAL_TABLET | Freq: Every day | ORAL | 4 refills | Status: DC
  Filled 2022-04-27: qty 30, 30d supply, fill #0

## 2022-04-27 MED ORDER — CLONIDINE HCL 0.2 MG PO TABS
0.2000 mg | ORAL_TABLET | Freq: Two times a day (BID) | ORAL | 2 refills | Status: DC
  Filled 2022-04-27: qty 30, 30d supply, fill #0
  Filled 2023-04-17: qty 30, 30d supply, fill #1

## 2022-05-16 ENCOUNTER — Other Ambulatory Visit (HOSPITAL_BASED_OUTPATIENT_CLINIC_OR_DEPARTMENT_OTHER): Payer: Self-pay

## 2022-05-18 ENCOUNTER — Ambulatory Visit: Payer: PRIVATE HEALTH INSURANCE | Admitting: Cardiology

## 2022-06-08 ENCOUNTER — Other Ambulatory Visit (HOSPITAL_BASED_OUTPATIENT_CLINIC_OR_DEPARTMENT_OTHER): Payer: Self-pay

## 2022-06-08 MED ORDER — OXYCODONE HCL 10 MG PO TABS
10.0000 mg | ORAL_TABLET | Freq: Four times a day (QID) | ORAL | 0 refills | Status: DC
  Filled 2022-06-13: qty 120, 30d supply, fill #0

## 2022-06-13 ENCOUNTER — Other Ambulatory Visit (HOSPITAL_BASED_OUTPATIENT_CLINIC_OR_DEPARTMENT_OTHER): Payer: Self-pay

## 2022-07-12 ENCOUNTER — Other Ambulatory Visit (HOSPITAL_BASED_OUTPATIENT_CLINIC_OR_DEPARTMENT_OTHER): Payer: Self-pay

## 2022-07-12 MED ORDER — SILDENAFIL CITRATE 100 MG PO TABS
100.0000 mg | ORAL_TABLET | Freq: Every day | ORAL | 3 refills | Status: AC | PRN
  Filled 2022-07-12: qty 10, 10d supply, fill #0
  Filled 2022-07-24 – 2022-11-29 (×4): qty 10, 10d supply, fill #1

## 2022-07-13 ENCOUNTER — Other Ambulatory Visit (HOSPITAL_BASED_OUTPATIENT_CLINIC_OR_DEPARTMENT_OTHER): Payer: Self-pay

## 2022-07-13 MED ORDER — OXYCODONE HCL 10 MG PO TABS
10.0000 mg | ORAL_TABLET | Freq: Four times a day (QID) | ORAL | 0 refills | Status: DC
  Filled 2022-07-13: qty 120, 30d supply, fill #0

## 2022-07-25 ENCOUNTER — Other Ambulatory Visit (HOSPITAL_BASED_OUTPATIENT_CLINIC_OR_DEPARTMENT_OTHER): Payer: Self-pay

## 2022-08-08 ENCOUNTER — Other Ambulatory Visit (HOSPITAL_BASED_OUTPATIENT_CLINIC_OR_DEPARTMENT_OTHER): Payer: Self-pay

## 2022-08-10 ENCOUNTER — Other Ambulatory Visit: Payer: Self-pay

## 2022-08-10 ENCOUNTER — Other Ambulatory Visit (HOSPITAL_BASED_OUTPATIENT_CLINIC_OR_DEPARTMENT_OTHER): Payer: Self-pay

## 2022-08-10 MED ORDER — OXYCODONE HCL 10 MG PO TABS
10.0000 mg | ORAL_TABLET | Freq: Four times a day (QID) | ORAL | 0 refills | Status: DC
  Filled 2022-08-10: qty 120, 30d supply, fill #0

## 2022-08-15 ENCOUNTER — Other Ambulatory Visit (HOSPITAL_BASED_OUTPATIENT_CLINIC_OR_DEPARTMENT_OTHER): Payer: Self-pay

## 2022-08-29 ENCOUNTER — Other Ambulatory Visit (HOSPITAL_BASED_OUTPATIENT_CLINIC_OR_DEPARTMENT_OTHER): Payer: Self-pay

## 2022-09-07 ENCOUNTER — Other Ambulatory Visit (HOSPITAL_BASED_OUTPATIENT_CLINIC_OR_DEPARTMENT_OTHER): Payer: Self-pay

## 2022-09-07 MED ORDER — OXYCODONE HCL 10 MG PO TABS
10.0000 mg | ORAL_TABLET | Freq: Four times a day (QID) | ORAL | 0 refills | Status: DC
  Filled 2022-09-07: qty 120, 30d supply, fill #0

## 2022-09-27 ENCOUNTER — Other Ambulatory Visit (HOSPITAL_BASED_OUTPATIENT_CLINIC_OR_DEPARTMENT_OTHER): Payer: Self-pay

## 2022-10-05 ENCOUNTER — Other Ambulatory Visit: Payer: Self-pay

## 2022-10-05 ENCOUNTER — Other Ambulatory Visit (HOSPITAL_BASED_OUTPATIENT_CLINIC_OR_DEPARTMENT_OTHER): Payer: Self-pay

## 2022-10-05 MED ORDER — OXYCODONE HCL 10 MG PO TABS
10.0000 mg | ORAL_TABLET | Freq: Four times a day (QID) | ORAL | 0 refills | Status: DC
  Filled 2022-10-05: qty 120, 30d supply, fill #0

## 2022-10-13 ENCOUNTER — Other Ambulatory Visit (HOSPITAL_BASED_OUTPATIENT_CLINIC_OR_DEPARTMENT_OTHER): Payer: Self-pay

## 2022-10-13 ENCOUNTER — Other Ambulatory Visit: Payer: Self-pay

## 2022-10-14 ENCOUNTER — Other Ambulatory Visit: Payer: Self-pay

## 2022-10-16 ENCOUNTER — Other Ambulatory Visit (HOSPITAL_BASED_OUTPATIENT_CLINIC_OR_DEPARTMENT_OTHER): Payer: Self-pay

## 2022-10-16 MED ORDER — NEBIVOLOL HCL 20 MG PO TABS
1.0000 | ORAL_TABLET | Freq: Every day | ORAL | 1 refills | Status: DC
  Filled 2022-10-18 – 2022-11-22 (×3): qty 90, 90d supply, fill #0
  Filled 2023-02-23: qty 90, 90d supply, fill #1

## 2022-10-16 MED ORDER — TAMSULOSIN HCL 0.4 MG PO CAPS
0.4000 mg | ORAL_CAPSULE | Freq: Every day | ORAL | 1 refills | Status: DC
  Filled 2022-10-16 – 2022-10-17 (×2): qty 90, 90d supply, fill #0
  Filled 2023-01-13: qty 90, 90d supply, fill #1

## 2022-10-17 ENCOUNTER — Other Ambulatory Visit: Payer: Self-pay

## 2022-10-17 ENCOUNTER — Other Ambulatory Visit (HOSPITAL_BASED_OUTPATIENT_CLINIC_OR_DEPARTMENT_OTHER): Payer: Self-pay

## 2022-10-18 ENCOUNTER — Other Ambulatory Visit (HOSPITAL_COMMUNITY): Payer: Self-pay

## 2022-10-18 ENCOUNTER — Other Ambulatory Visit: Payer: Self-pay

## 2022-10-18 ENCOUNTER — Other Ambulatory Visit (HOSPITAL_BASED_OUTPATIENT_CLINIC_OR_DEPARTMENT_OTHER): Payer: Self-pay

## 2022-10-29 ENCOUNTER — Other Ambulatory Visit (HOSPITAL_BASED_OUTPATIENT_CLINIC_OR_DEPARTMENT_OTHER): Payer: Self-pay

## 2022-10-31 ENCOUNTER — Other Ambulatory Visit (HOSPITAL_BASED_OUTPATIENT_CLINIC_OR_DEPARTMENT_OTHER): Payer: Self-pay

## 2022-11-02 ENCOUNTER — Other Ambulatory Visit (HOSPITAL_BASED_OUTPATIENT_CLINIC_OR_DEPARTMENT_OTHER): Payer: Self-pay

## 2022-11-02 MED ORDER — OXYCODONE HCL 10 MG PO TABS
10.0000 mg | ORAL_TABLET | Freq: Four times a day (QID) | ORAL | 0 refills | Status: DC
  Filled 2022-11-02: qty 120, 30d supply, fill #0

## 2022-11-22 ENCOUNTER — Other Ambulatory Visit: Payer: Self-pay

## 2022-11-22 ENCOUNTER — Other Ambulatory Visit (HOSPITAL_BASED_OUTPATIENT_CLINIC_OR_DEPARTMENT_OTHER): Payer: Self-pay

## 2022-11-29 ENCOUNTER — Other Ambulatory Visit (HOSPITAL_BASED_OUTPATIENT_CLINIC_OR_DEPARTMENT_OTHER): Payer: Self-pay

## 2022-11-29 MED ORDER — ALISKIREN FUMARATE 150 MG PO TABS
150.0000 mg | ORAL_TABLET | Freq: Every day | ORAL | 1 refills | Status: DC
  Filled 2022-11-29 – 2023-01-13 (×3): qty 90, 90d supply, fill #0
  Filled 2023-04-17: qty 90, 90d supply, fill #1

## 2022-11-30 ENCOUNTER — Other Ambulatory Visit (HOSPITAL_BASED_OUTPATIENT_CLINIC_OR_DEPARTMENT_OTHER): Payer: Self-pay

## 2022-12-01 ENCOUNTER — Other Ambulatory Visit (HOSPITAL_BASED_OUTPATIENT_CLINIC_OR_DEPARTMENT_OTHER): Payer: Self-pay

## 2022-12-01 MED ORDER — OXYCODONE HCL 10 MG PO TABS
10.0000 mg | ORAL_TABLET | Freq: Four times a day (QID) | ORAL | 0 refills | Status: DC
  Filled 2022-12-01: qty 120, 30d supply, fill #0

## 2022-12-04 NOTE — Progress Notes (Signed)
Cardiology Office Note:  .   Date:  12/11/2022  ID:  Jesus [name], DOB May 31, 1945, MRN [id number] PCP: Jesus Able, MD  [facility] Providers Cardiologist:  Jesus Lemma, MD     Chief Complaint  Patient presents with   Coronary Artery Disease    Nonocclusive CAD   New Patient (Initial Visit)    Technically new patient, I have not seen him in over 3 years.  Doing well.  He is the husband of a longstanding patient of mine who passed away last fall.    History of Present Illness: .     Jesus [name] is a very pleasant 77 y.o. male  with a PMH notable for h/o PE, HTN &HLD who presents here for re-establishing care at the request of Jesus Able, MD.  Wife, Jesus [name] died last year.   Jesus [name] was last seen back in March 2021 also at the request of Dr. Pecola Leisure.  This was follow-up evaluation for hypertension send his blood pressure has been pretty well-controlled in the 120/80 mmHg range.  He did not like taking clonidine because it made him sleepy so he had not been taking it.  No cardiac symptoms.  Just some mild edema at the end of the day.  Noted knee pain limiting activity.  Suggest the possibility of knee surgery.  At that time he was on chlorthalidone 25 mg daily, Bystolic 21 daily and Tekturna 150 mg daily.  We added chlorthalidone 12.5 mg daily for edema and additional blood pressure control.  Noted LDL < 50.  Plan was to month follow-up which never happened > in follow-up discussion the plan was to use chlorthalidone more as as needed.    Subjective   INTERVAL HISTORY Jesus [name] presents to reestablish cardiology care.  He has been doing well from a cardiac standpoint and no active cardiac symptoms.  Activity limited by knee pain.  The majority of our time was really discussing the loss of his wife last year.  She had had issues with heart failure, had complications and eventually died last fall.  He initially "went into a very dark space" after initially feeling  okay while up with his family in [place].  Once he got back to the home that they did then, he did not do well and turned around and went right back again.  After several months of being in the ER, he "got his that together "and decided to come back in move on with life.  He has met a woman who is also recently widowed but was from a relationship that had been somewhat less than favorable compared to his which was wonderful.  They have since performed a partnership) relationship-needed with plans to be married, but maintain each other's company.  They travel together and he is now much happier.  He knows that he is never can to get back what he had with his wife, but is happy now.  The major thing that is keeping him from being physically active as far as exercise is his knee pain.   ROS:  Cardiovascular ROS: no chest pain or dyspnea on exertion negative for - edema, irregular heartbeat, orthopnea, palpitations, paroxysmal nocturnal dyspnea, rapid heart rate, shortness of breath, or syncope or near syncope TIA or amaurosis fugax, claudication Review of Systems - Negative except knee pain, and the psychological issues noted above.    Objective   Studies Reviewed: [name]   EKG Interpretation Date/Time:  Monday December 05 2022 09:40:58 EDT Ventricular  Rate:  77 PR Interval:  232 QRS Duration:  74 QT Interval:  374 QTC Calculation: 423 R Axis:   27  Text Interpretation: Sinus rhythm with 1st degree A-V block T wave abnormality, consider lateral ischemia When compared with ECG of 23-May-2020 11:04, Premature ventricular complexes are no longer Present PR interval has increased T wave inversion now evident in Lateral leads Confirmed by Jesus [name] ([id number]) on 12/05/2022 9:57:30 AM    Sleep Study/OSA Evaluation 04/11/2021: Noted safety primary snoring discussed sleep hygiene but no significant evidence of obstructive sleep apnea.  Mild oxygen desaturation only noted.  EKG showed PACs. ECHO 09/17/2014:  Normal LV size and function.  EF 55 to 60%.  No RWMA.  RV not well-seen but there was abnormal septal motion with the patient tachycardia suggesting mild pressure overload.  Risk Assessment/Calculations:    N/A  Physical Exam:   VS:  BP 138/80 (BP Location: Left Arm, Patient Position: Sitting, Cuff Size: Normal)   Pulse 84   Ht 5\' 10"  (1.778 m)   Wt 230 lb 6.4 oz (104.5 kg)   SpO2 97%   BMI 33.06 kg/m   ; notes that he had not yet taken his BP medication today, was in a hurry. Wt Readings from Last 3 Encounters:  12/05/22 230 lb 6.4 oz (104.5 kg)  04/07/21 231 lb (104.8 kg)  05/26/20 230 lb 9.6 oz (104.6 kg)    GEN: Well nourished, well developed in no acute distress; appearing.  Well-groomed.  Well-nourished. NECK: No JVD; No carotid bruits CARDIAC: Normal S1, S2; RRR, no murmurs, rubs, gallops RESPIRATORY:  Clear to auscultation without rales, wheezing or rhonchi ; nonlabored, good air movement. ABDOMEN: Soft, non-tender, non-distended EXTREMITIES:  No edema; No deformity      ASSESSMENT AND PLAN: .    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  No active anginal symptoms.  However we will try to reduce his risk factors.  Reassess lipids.  They have been well-controlled in the past.  BP is the major issue and he is on multiple medications.  Borderline today but he had not yet taking some of his medications this morning.  Follow closely with PCP.  May need additional therapy.       Relevant Orders   Lipid panel (Completed)   Comprehensive metabolic panel (Completed)   CBC (Completed)   Essential hypertension - Primary (Chronic)    Borderline pressures today, he is on lots of medications including Bystolic, Tekturna, olmesartan-HCTZ, chlorthalidone and clonidine.  Not sure of the benefit of HCTZ with the olmesartan.  He is pretty much at high doses of most  medications.  Clonidine could be increased further.  There is also potential room for amlodipine.  Has been monitored by PCP.  Seems stable.      Relevant Orders   EKG 12-Lead (Completed)     Other   Dyslipidemia    Labs followed by PCP in the past, but have not been checked recently.  He actually is not on any medication.  Reassess lipids to see if they need for further testing.      History of pulmonary embolism (Chronic)    No longer on DOAC      Relevant Orders   Comprehensive metabolic panel (Completed)   CBC (Completed)   Obesity (BMI 30.0-34.9) (Chronic)    Working on diet and exercise.  Now  that he is beyond the morning stage, he is hoping that his dietary adjustments will start helping.  Weights have been stable for 2 years.      Relevant Orders   Lipid panel (Completed)   Comprehensive metabolic panel (Completed)   CBC (Completed)   Tachycardia (Chronic)    Well-controlled on Bystolic      Relevant Orders   EKG 12-Lead (Completed)          Dispo: Return in about 1 year (around 12/05/2023) for 1 Yr Follow-up.  Total time spent: 24 min spent with patient + 18 min spent charting = 42 min     Signed, Marykay Lex, MD, MS Jesus [name], M.D., M.S. Interventional Cardiologist  [facility]  Pager # [phone] Phone # [phone] [address] [place], [place] [address]

## 2022-12-05 ENCOUNTER — Encounter: Payer: Self-pay | Admitting: Cardiology

## 2022-12-05 ENCOUNTER — Ambulatory Visit: Payer: Medicare Other | Attending: Cardiology | Admitting: Cardiology

## 2022-12-05 VITALS — BP 138/80 | HR 84 | Ht 70.0 in | Wt 230.4 lb

## 2022-12-05 DIAGNOSIS — R000 Tachycardia, unspecified: Secondary | ICD-10-CM | POA: Insufficient documentation

## 2022-12-05 DIAGNOSIS — E669 Obesity, unspecified: Secondary | ICD-10-CM | POA: Diagnosis present

## 2022-12-05 DIAGNOSIS — I2510 Atherosclerotic heart disease of native coronary artery without angina pectoris: Secondary | ICD-10-CM | POA: Insufficient documentation

## 2022-12-05 DIAGNOSIS — Z86711 Personal history of pulmonary embolism: Secondary | ICD-10-CM | POA: Insufficient documentation

## 2022-12-05 DIAGNOSIS — I10 Essential (primary) hypertension: Secondary | ICD-10-CM | POA: Diagnosis not present

## 2022-12-05 DIAGNOSIS — E785 Hyperlipidemia, unspecified: Secondary | ICD-10-CM

## 2022-12-05 NOTE — Patient Instructions (Addendum)
Medication Instructions:  No changes   *If you need a refill on your cardiac medications before your next appointment, please call your pharmacy*   Lab Work: CBC CMP LIPID If you have labs (blood work) drawn today and your tests are completely normal, you will receive your results only by: MyChart Message (if you have MyChart) OR A paper copy in the mail If you have any lab test that is abnormal or we need to change your treatment, we will call you to review the results.   Testing/Procedures: Not needed   Follow-Up: At [facility], you and your health needs are our priority.  As part of our continuing mission to provide you with exceptional heart care, we have created designated Provider Care Teams.  These Care Teams include your primary Cardiologist (physician) and Advanced Practice Providers (APPs -  Physician Assistants and Nurse Practitioners) who all work together to provide you with the care you need, when you need it.     Your next appointment:   12 month(s)  The format for your next appointment:   In Person  Provider:   Bryan Lemma, MD    Other Instructions

## 2022-12-06 LAB — COMPREHENSIVE METABOLIC PANEL
ALT: 10 IU/L (ref 0–44)
AST: 14 IU/L (ref 0–40)
Albumin: 4.1 g/dL (ref 3.8–4.8)
Alkaline Phosphatase: 66 IU/L (ref 44–121)
BUN/Creatinine Ratio: 14 (ref 10–24)
BUN: 13 mg/dL (ref 8–27)
Bilirubin Total: 0.4 mg/dL (ref 0.0–1.2)
CO2: 26 mmol/L (ref 20–29)
Calcium: 9.7 mg/dL (ref 8.6–10.2)
Chloride: 105 mmol/L (ref 96–106)
Creatinine, Ser: 0.96 mg/dL (ref 0.76–1.27)
Globulin, Total: 2.5 g/dL (ref 1.5–4.5)
Glucose: 83 mg/dL (ref 70–99)
Potassium: 4.2 mmol/L (ref 3.5–5.2)
Sodium: 142 mmol/L (ref 134–144)
Total Protein: 6.6 g/dL (ref 6.0–8.5)
eGFR: 82 mL/min/{1.73_m2} (ref >59)

## 2022-12-06 LAB — LIPID PANEL
Chol/HDL Ratio: 2.3 ratio (ref 0.0–5.0)
Cholesterol, Total: 115 mg/dL (ref 100–199)
HDL: 50 mg/dL (ref >39)
LDL Chol Calc (NIH): 47 mg/dL (ref 0–99)
Triglycerides: 93 mg/dL (ref 0–149)
VLDL Cholesterol Cal: 18 mg/dL (ref 5–40)

## 2022-12-06 LAB — CBC
Hematocrit: 43.2 % (ref 37.5–51.0)
Hemoglobin: 14.4 g/dL (ref 13.0–17.7)
MCH: 29.6 pg (ref 26.6–33.0)
MCHC: 33.3 g/dL (ref 31.5–35.7)
MCV: 89 fL (ref 79–97)
Platelets: 179 10*3/uL (ref 150–450)
RBC: 4.87 x10E6/uL (ref 4.14–5.80)
RDW: 13.9 % (ref 11.6–15.4)
WBC: 4.8 10*3/uL (ref 3.4–10.8)

## 2022-12-11 ENCOUNTER — Encounter: Payer: Self-pay | Admitting: Cardiology

## 2022-12-11 DIAGNOSIS — E785 Hyperlipidemia, unspecified: Secondary | ICD-10-CM | POA: Insufficient documentation

## 2022-12-11 NOTE — Assessment & Plan Note (Signed)
No longer on DOAC.

## 2022-12-11 NOTE — Assessment & Plan Note (Signed)
Well controlled on Bystolic

## 2022-12-11 NOTE — Assessment & Plan Note (Signed)
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  No active anginal symptoms.  However we will try to reduce his risk factors.  Reassess lipids.  They have been well-controlled in the past.  BP is the major issue and he is on multiple medications.  Borderline today but he had not yet taking some of his medications this morning.  Follow closely with PCP.  May need additional therapy.

## 2022-12-11 NOTE — Assessment & Plan Note (Signed)
Working on diet and exercise.  Now that he is beyond the morning stage, he is hoping that his dietary adjustments will start helping.  Weights have been stable for 2 years.

## 2022-12-11 NOTE — Assessment & Plan Note (Signed)
Labs followed by PCP in the past, but have not been checked recently.  He actually is not on any medication.  Reassess lipids to see if they need for further testing.

## 2022-12-11 NOTE — Assessment & Plan Note (Signed)
Borderline pressures today, he is on lots of medications including Bystolic, Tekturna, olmesartan-HCTZ, chlorthalidone and clonidine.  Not sure of the benefit of HCTZ with the olmesartan.  He is pretty much at high doses of most medications.  Clonidine could be increased further.  There is also potential room for amlodipine.  Has been monitored by PCP.  Seems stable.

## 2022-12-19 ENCOUNTER — Other Ambulatory Visit (HOSPITAL_BASED_OUTPATIENT_CLINIC_OR_DEPARTMENT_OTHER): Payer: Self-pay

## 2022-12-28 ENCOUNTER — Other Ambulatory Visit (HOSPITAL_BASED_OUTPATIENT_CLINIC_OR_DEPARTMENT_OTHER): Payer: Self-pay

## 2022-12-28 MED ORDER — OXYCODONE HCL 10 MG PO TABS
10.0000 mg | ORAL_TABLET | Freq: Four times a day (QID) | ORAL | 0 refills | Status: DC
  Filled 2022-12-29: qty 120, 30d supply, fill #0

## 2022-12-29 ENCOUNTER — Other Ambulatory Visit (HOSPITAL_BASED_OUTPATIENT_CLINIC_OR_DEPARTMENT_OTHER): Payer: Self-pay

## 2023-01-12 ENCOUNTER — Other Ambulatory Visit (HOSPITAL_BASED_OUTPATIENT_CLINIC_OR_DEPARTMENT_OTHER): Payer: Self-pay

## 2023-01-12 ENCOUNTER — Other Ambulatory Visit: Payer: Self-pay

## 2023-01-13 ENCOUNTER — Other Ambulatory Visit (HOSPITAL_BASED_OUTPATIENT_CLINIC_OR_DEPARTMENT_OTHER): Payer: Self-pay

## 2023-01-13 ENCOUNTER — Other Ambulatory Visit: Payer: Self-pay

## 2023-01-19 ENCOUNTER — Other Ambulatory Visit (HOSPITAL_BASED_OUTPATIENT_CLINIC_OR_DEPARTMENT_OTHER): Payer: Self-pay

## 2023-01-23 ENCOUNTER — Other Ambulatory Visit (HOSPITAL_BASED_OUTPATIENT_CLINIC_OR_DEPARTMENT_OTHER): Payer: Self-pay

## 2023-01-23 MED ORDER — OXYCODONE HCL 10 MG PO TABS
10.0000 mg | ORAL_TABLET | Freq: Four times a day (QID) | ORAL | 0 refills | Status: DC
  Filled 2023-01-23 – 2023-01-26 (×2): qty 120, 30d supply, fill #0

## 2023-01-26 ENCOUNTER — Other Ambulatory Visit (HOSPITAL_COMMUNITY): Payer: Self-pay

## 2023-01-26 ENCOUNTER — Other Ambulatory Visit: Payer: Self-pay

## 2023-01-26 ENCOUNTER — Other Ambulatory Visit (HOSPITAL_BASED_OUTPATIENT_CLINIC_OR_DEPARTMENT_OTHER): Payer: Self-pay

## 2023-02-15 ENCOUNTER — Other Ambulatory Visit (HOSPITAL_BASED_OUTPATIENT_CLINIC_OR_DEPARTMENT_OTHER): Payer: Self-pay

## 2023-02-15 MED ORDER — OXYCODONE HCL 10 MG PO TABS
10.0000 mg | ORAL_TABLET | Freq: Four times a day (QID) | ORAL | 0 refills | Status: DC
  Filled 2023-02-15 – 2023-02-24 (×2): qty 120, 30d supply, fill #0

## 2023-02-24 ENCOUNTER — Other Ambulatory Visit (HOSPITAL_BASED_OUTPATIENT_CLINIC_OR_DEPARTMENT_OTHER): Payer: Self-pay

## 2023-02-24 ENCOUNTER — Other Ambulatory Visit: Payer: Self-pay

## 2023-02-25 ENCOUNTER — Other Ambulatory Visit (HOSPITAL_BASED_OUTPATIENT_CLINIC_OR_DEPARTMENT_OTHER): Payer: Self-pay

## 2023-03-15 ENCOUNTER — Other Ambulatory Visit (HOSPITAL_BASED_OUTPATIENT_CLINIC_OR_DEPARTMENT_OTHER): Payer: Self-pay

## 2023-03-16 ENCOUNTER — Other Ambulatory Visit (HOSPITAL_BASED_OUTPATIENT_CLINIC_OR_DEPARTMENT_OTHER): Payer: Self-pay

## 2023-03-16 MED ORDER — APIXABAN 5 MG PO TABS
5.0000 mg | ORAL_TABLET | Freq: Two times a day (BID) | ORAL | 4 refills | Status: DC
  Filled 2023-03-16 – 2023-04-17 (×2): qty 60, 30d supply, fill #0
  Filled 2023-05-19: qty 60, 30d supply, fill #1
  Filled 2023-06-20: qty 60, 30d supply, fill #2
  Filled 2023-07-21: qty 60, 30d supply, fill #3
  Filled 2023-09-20: qty 60, 30d supply, fill #4

## 2023-03-16 MED ORDER — OXYCODONE HCL 10 MG PO TABS
10.0000 mg | ORAL_TABLET | Freq: Four times a day (QID) | ORAL | 0 refills | Status: DC
  Filled 2023-03-25: qty 120, 30d supply, fill #0

## 2023-03-21 ENCOUNTER — Other Ambulatory Visit (HOSPITAL_BASED_OUTPATIENT_CLINIC_OR_DEPARTMENT_OTHER): Payer: Self-pay

## 2023-03-25 ENCOUNTER — Other Ambulatory Visit (HOSPITAL_BASED_OUTPATIENT_CLINIC_OR_DEPARTMENT_OTHER): Payer: Self-pay

## 2023-04-02 ENCOUNTER — Other Ambulatory Visit (HOSPITAL_BASED_OUTPATIENT_CLINIC_OR_DEPARTMENT_OTHER): Payer: Self-pay

## 2023-04-03 ENCOUNTER — Other Ambulatory Visit (HOSPITAL_BASED_OUTPATIENT_CLINIC_OR_DEPARTMENT_OTHER): Payer: Self-pay

## 2023-04-03 MED ORDER — TAMSULOSIN HCL 0.4 MG PO CAPS
0.4000 mg | ORAL_CAPSULE | Freq: Every day | ORAL | 1 refills | Status: DC
  Filled 2023-04-03: qty 90, 90d supply, fill #0
  Filled 2023-07-11: qty 90, 90d supply, fill #1

## 2023-04-17 ENCOUNTER — Other Ambulatory Visit: Payer: Self-pay

## 2023-04-17 ENCOUNTER — Other Ambulatory Visit (HOSPITAL_BASED_OUTPATIENT_CLINIC_OR_DEPARTMENT_OTHER): Payer: Self-pay

## 2023-04-19 ENCOUNTER — Other Ambulatory Visit (HOSPITAL_BASED_OUTPATIENT_CLINIC_OR_DEPARTMENT_OTHER): Payer: Self-pay

## 2023-04-19 MED ORDER — OXYCODONE HCL 10 MG PO TABS
10.0000 mg | ORAL_TABLET | Freq: Four times a day (QID) | ORAL | 0 refills | Status: DC
  Filled 2023-04-22: qty 120, 30d supply, fill #0

## 2023-04-22 ENCOUNTER — Other Ambulatory Visit (HOSPITAL_BASED_OUTPATIENT_CLINIC_OR_DEPARTMENT_OTHER): Payer: Self-pay

## 2023-04-24 ENCOUNTER — Other Ambulatory Visit (HOSPITAL_COMMUNITY): Payer: Self-pay

## 2023-04-24 ENCOUNTER — Other Ambulatory Visit: Payer: Self-pay

## 2023-04-24 ENCOUNTER — Other Ambulatory Visit (HOSPITAL_BASED_OUTPATIENT_CLINIC_OR_DEPARTMENT_OTHER): Payer: Self-pay

## 2023-05-15 ENCOUNTER — Other Ambulatory Visit (HOSPITAL_BASED_OUTPATIENT_CLINIC_OR_DEPARTMENT_OTHER): Payer: Self-pay

## 2023-05-17 ENCOUNTER — Other Ambulatory Visit (HOSPITAL_BASED_OUTPATIENT_CLINIC_OR_DEPARTMENT_OTHER): Payer: Self-pay

## 2023-05-17 MED ORDER — OXYCODONE HCL 10 MG PO TABS
10.0000 mg | ORAL_TABLET | Freq: Four times a day (QID) | ORAL | 0 refills | Status: DC
  Filled 2023-05-22: qty 120, 30d supply, fill #0

## 2023-05-18 ENCOUNTER — Other Ambulatory Visit (HOSPITAL_BASED_OUTPATIENT_CLINIC_OR_DEPARTMENT_OTHER): Payer: Self-pay

## 2023-05-19 ENCOUNTER — Other Ambulatory Visit (HOSPITAL_BASED_OUTPATIENT_CLINIC_OR_DEPARTMENT_OTHER): Payer: Self-pay

## 2023-05-19 ENCOUNTER — Other Ambulatory Visit: Payer: Self-pay

## 2023-05-22 ENCOUNTER — Other Ambulatory Visit: Payer: Self-pay

## 2023-05-22 ENCOUNTER — Other Ambulatory Visit (HOSPITAL_BASED_OUTPATIENT_CLINIC_OR_DEPARTMENT_OTHER): Payer: Self-pay

## 2023-05-22 MED ORDER — NEBIVOLOL HCL 20 MG PO TABS
20.0000 mg | ORAL_TABLET | Freq: Every day | ORAL | 1 refills | Status: DC
  Filled 2023-05-22: qty 90, 90d supply, fill #0
  Filled 2023-08-02: qty 90, 90d supply, fill #1

## 2023-05-25 ENCOUNTER — Other Ambulatory Visit (HOSPITAL_BASED_OUTPATIENT_CLINIC_OR_DEPARTMENT_OTHER): Payer: Self-pay

## 2023-06-13 ENCOUNTER — Other Ambulatory Visit (HOSPITAL_BASED_OUTPATIENT_CLINIC_OR_DEPARTMENT_OTHER): Payer: Self-pay

## 2023-06-13 MED ORDER — OXYCODONE HCL 10 MG PO TABS
10.0000 mg | ORAL_TABLET | Freq: Four times a day (QID) | ORAL | 0 refills | Status: DC
  Filled 2023-06-19: qty 120, 30d supply, fill #0

## 2023-06-19 ENCOUNTER — Other Ambulatory Visit (HOSPITAL_BASED_OUTPATIENT_CLINIC_OR_DEPARTMENT_OTHER): Payer: Self-pay

## 2023-06-19 ENCOUNTER — Other Ambulatory Visit: Payer: Self-pay

## 2023-06-20 ENCOUNTER — Other Ambulatory Visit: Payer: Self-pay

## 2023-06-20 ENCOUNTER — Other Ambulatory Visit (HOSPITAL_BASED_OUTPATIENT_CLINIC_OR_DEPARTMENT_OTHER): Payer: Self-pay

## 2023-06-20 MED ORDER — CLONIDINE HCL 0.2 MG PO TABS
0.2000 mg | ORAL_TABLET | Freq: Two times a day (BID) | ORAL | 3 refills | Status: AC
  Filled 2023-06-20: qty 60, 30d supply, fill #0

## 2023-07-12 ENCOUNTER — Other Ambulatory Visit (HOSPITAL_BASED_OUTPATIENT_CLINIC_OR_DEPARTMENT_OTHER): Payer: Self-pay

## 2023-07-12 MED ORDER — OXYCODONE HCL 10 MG PO TABS
10.0000 mg | ORAL_TABLET | Freq: Four times a day (QID) | ORAL | 0 refills | Status: DC
  Filled 2023-07-17: qty 120, 30d supply, fill #0

## 2023-07-17 ENCOUNTER — Other Ambulatory Visit (HOSPITAL_BASED_OUTPATIENT_CLINIC_OR_DEPARTMENT_OTHER): Payer: Self-pay

## 2023-07-18 ENCOUNTER — Other Ambulatory Visit: Payer: Self-pay

## 2023-07-18 ENCOUNTER — Other Ambulatory Visit (HOSPITAL_BASED_OUTPATIENT_CLINIC_OR_DEPARTMENT_OTHER): Payer: Self-pay

## 2023-07-18 MED ORDER — ALBUTEROL SULFATE HFA 108 (90 BASE) MCG/ACT IN AERS
1.0000 | INHALATION_SPRAY | RESPIRATORY_TRACT | 3 refills | Status: AC | PRN
  Filled 2023-07-18: qty 6.7, 30d supply, fill #0
  Filled 2023-10-16: qty 6.7, 30d supply, fill #1
  Filled 2023-11-16: qty 6.7, 30d supply, fill #2
  Filled 2024-03-07: qty 6.7, 30d supply, fill #3

## 2023-07-18 MED ORDER — ALISKIREN FUMARATE 150 MG PO TABS
150.0000 mg | ORAL_TABLET | Freq: Every day | ORAL | 1 refills | Status: DC
  Filled 2023-07-18: qty 90, 90d supply, fill #0
  Filled 2023-10-18: qty 90, 90d supply, fill #1

## 2023-08-02 ENCOUNTER — Other Ambulatory Visit (HOSPITAL_BASED_OUTPATIENT_CLINIC_OR_DEPARTMENT_OTHER): Payer: Self-pay

## 2023-08-09 ENCOUNTER — Other Ambulatory Visit (HOSPITAL_BASED_OUTPATIENT_CLINIC_OR_DEPARTMENT_OTHER): Payer: Self-pay

## 2023-08-09 MED ORDER — OXYCODONE HCL 10 MG PO TABS
10.0000 mg | ORAL_TABLET | Freq: Four times a day (QID) | ORAL | 0 refills | Status: DC
  Filled 2023-08-14: qty 120, 30d supply, fill #0

## 2023-08-14 ENCOUNTER — Other Ambulatory Visit (HOSPITAL_BASED_OUTPATIENT_CLINIC_OR_DEPARTMENT_OTHER): Payer: Self-pay

## 2023-08-30 ENCOUNTER — Other Ambulatory Visit (HOSPITAL_BASED_OUTPATIENT_CLINIC_OR_DEPARTMENT_OTHER): Payer: Self-pay

## 2023-09-06 ENCOUNTER — Other Ambulatory Visit (HOSPITAL_BASED_OUTPATIENT_CLINIC_OR_DEPARTMENT_OTHER): Payer: Self-pay

## 2023-09-06 MED ORDER — OXYCODONE HCL 10 MG PO TABS
10.0000 mg | ORAL_TABLET | Freq: Four times a day (QID) | ORAL | 0 refills | Status: DC
  Filled 2023-09-11: qty 120, 30d supply, fill #0

## 2023-09-11 ENCOUNTER — Other Ambulatory Visit (HOSPITAL_BASED_OUTPATIENT_CLINIC_OR_DEPARTMENT_OTHER): Payer: Self-pay

## 2023-10-04 ENCOUNTER — Other Ambulatory Visit (HOSPITAL_BASED_OUTPATIENT_CLINIC_OR_DEPARTMENT_OTHER): Payer: Self-pay

## 2023-10-04 MED ORDER — OXYCODONE HCL 10 MG PO TABS
10.0000 mg | ORAL_TABLET | Freq: Four times a day (QID) | ORAL | 0 refills | Status: DC
  Filled 2023-10-09 – 2023-10-10 (×2): qty 120, 30d supply, fill #0

## 2023-10-04 MED ORDER — FLUTICASONE PROPIONATE 50 MCG/ACT NA SUSP
2.0000 | Freq: Every day | NASAL | 2 refills | Status: DC
  Filled 2023-10-04: qty 16, 30d supply, fill #0
  Filled 2023-11-03: qty 16, 30d supply, fill #1
  Filled 2023-12-13: qty 16, 30d supply, fill #2

## 2023-10-06 ENCOUNTER — Other Ambulatory Visit (HOSPITAL_BASED_OUTPATIENT_CLINIC_OR_DEPARTMENT_OTHER): Payer: Self-pay

## 2023-10-09 ENCOUNTER — Other Ambulatory Visit (HOSPITAL_BASED_OUTPATIENT_CLINIC_OR_DEPARTMENT_OTHER): Payer: Self-pay

## 2023-10-09 ENCOUNTER — Other Ambulatory Visit: Payer: Self-pay

## 2023-10-09 ENCOUNTER — Other Ambulatory Visit (HOSPITAL_COMMUNITY): Payer: Self-pay

## 2023-10-09 MED ORDER — TAMSULOSIN HCL 0.4 MG PO CAPS
0.4000 mg | ORAL_CAPSULE | Freq: Every day | ORAL | 1 refills | Status: DC
  Filled 2023-10-09 – 2023-10-10 (×2): qty 90, 90d supply, fill #0
  Filled 2024-01-03: qty 90, 90d supply, fill #1

## 2023-10-10 ENCOUNTER — Other Ambulatory Visit (HOSPITAL_BASED_OUTPATIENT_CLINIC_OR_DEPARTMENT_OTHER): Payer: Self-pay

## 2023-10-10 ENCOUNTER — Other Ambulatory Visit: Payer: Self-pay

## 2023-10-18 ENCOUNTER — Other Ambulatory Visit: Payer: Self-pay

## 2023-10-18 ENCOUNTER — Other Ambulatory Visit (HOSPITAL_BASED_OUTPATIENT_CLINIC_OR_DEPARTMENT_OTHER): Payer: Self-pay

## 2023-10-19 ENCOUNTER — Other Ambulatory Visit: Payer: Self-pay

## 2023-10-19 ENCOUNTER — Other Ambulatory Visit (HOSPITAL_BASED_OUTPATIENT_CLINIC_OR_DEPARTMENT_OTHER): Payer: Self-pay

## 2023-10-19 MED ORDER — APIXABAN 5 MG PO TABS
5.0000 mg | ORAL_TABLET | Freq: Two times a day (BID) | ORAL | 4 refills | Status: DC
  Filled 2023-10-19: qty 60, 30d supply, fill #0
  Filled 2023-11-15: qty 60, 30d supply, fill #1
  Filled 2023-12-14: qty 60, 30d supply, fill #2
  Filled 2024-01-16: qty 60, 30d supply, fill #3
  Filled 2024-02-08: qty 60, 30d supply, fill #4

## 2023-10-25 ENCOUNTER — Other Ambulatory Visit (HOSPITAL_BASED_OUTPATIENT_CLINIC_OR_DEPARTMENT_OTHER): Payer: Self-pay

## 2023-11-08 ENCOUNTER — Other Ambulatory Visit (HOSPITAL_BASED_OUTPATIENT_CLINIC_OR_DEPARTMENT_OTHER): Payer: Self-pay

## 2023-11-08 MED ORDER — OXYCODONE HCL 10 MG PO TABS
10.0000 mg | ORAL_TABLET | Freq: Four times a day (QID) | ORAL | 0 refills | Status: DC
  Filled 2023-11-08: qty 120, 30d supply, fill #0

## 2023-11-10 ENCOUNTER — Other Ambulatory Visit (HOSPITAL_BASED_OUTPATIENT_CLINIC_OR_DEPARTMENT_OTHER): Payer: Self-pay

## 2023-11-13 ENCOUNTER — Other Ambulatory Visit (HOSPITAL_BASED_OUTPATIENT_CLINIC_OR_DEPARTMENT_OTHER): Payer: Self-pay

## 2023-11-13 MED ORDER — NEBIVOLOL HCL 20 MG PO TABS
20.0000 mg | ORAL_TABLET | Freq: Every day | ORAL | 1 refills | Status: DC
  Filled 2023-11-13: qty 90, 90d supply, fill #0
  Filled 2024-02-01 (×2): qty 90, 90d supply, fill #1

## 2023-11-14 ENCOUNTER — Other Ambulatory Visit (HOSPITAL_BASED_OUTPATIENT_CLINIC_OR_DEPARTMENT_OTHER): Payer: Self-pay

## 2023-12-07 ENCOUNTER — Other Ambulatory Visit (HOSPITAL_BASED_OUTPATIENT_CLINIC_OR_DEPARTMENT_OTHER): Payer: Self-pay

## 2023-12-07 MED ORDER — FLUTICASONE-SALMETEROL 115-21 MCG/ACT IN AERO
2.0000 | INHALATION_SPRAY | Freq: Two times a day (BID) | RESPIRATORY_TRACT | 1 refills | Status: AC
  Filled 2023-12-07: qty 12, 30d supply, fill #0
  Filled 2023-12-20 – 2024-01-03 (×2): qty 12, 30d supply, fill #1

## 2023-12-07 MED ORDER — OXYCODONE HCL 10 MG PO TABS
10.0000 mg | ORAL_TABLET | Freq: Four times a day (QID) | ORAL | 0 refills | Status: DC
  Filled 2023-12-07: qty 120, 30d supply, fill #0

## 2023-12-07 MED ORDER — PREDNISONE 10 MG PO TABS
10.0000 mg | ORAL_TABLET | Freq: Three times a day (TID) | ORAL | 1 refills | Status: DC
  Filled 2023-12-07: qty 30, 10d supply, fill #0

## 2023-12-11 ENCOUNTER — Other Ambulatory Visit (HOSPITAL_BASED_OUTPATIENT_CLINIC_OR_DEPARTMENT_OTHER): Payer: Self-pay

## 2023-12-11 MED ORDER — PROMETHAZINE HCL 6.25 MG/5ML PO SOLN
6.2500 mg | ORAL | 1 refills | Status: DC
  Filled 2023-12-11: qty 300, 10d supply, fill #0
  Filled 2023-12-20: qty 100, 3d supply, fill #1
  Filled 2023-12-20: qty 200, 7d supply, fill #1

## 2023-12-11 MED ORDER — AZITHROMYCIN 250 MG PO TABS
ORAL_TABLET | ORAL | 1 refills | Status: DC
  Filled 2023-12-11: qty 6, 5d supply, fill #0
  Filled 2023-12-21: qty 6, 5d supply, fill #1

## 2023-12-11 NOTE — Progress Notes (Deleted)
 Cardiology Office Note   Date:  12/11/2023  ID:  Jesus [name], DOB 12/11/1945, MRN [id number] PCP: Ilah Crigler, MD  [facility] Providers Cardiologist:  Alm Clay, MD   History of Present Illness Jesus [name] is a 78 y.o. male with a past medical history of coronary artery disease, hypertension, hyperlipidemia, chronic pulmonary embolism, obesity, and tachycardia here for annual follow-up appointment.  Patient was seen last year and has been doing well from cardiac standpoint.  No active cardiac symptoms at that time.  Activity was limited by knee pain.  Unfortunately lost his wife a year prior.  Had some issues with heart failure, complications and eventually she passed away a year ago.  He initially went to a very dark place after initially feeling okay well up and with his family in [place] .  Once he got back to home he did not do well and spent several months in the ER.  He met a woman who is also recently widowed but was from a relationship that had been somewhat less than favorable compared to his last which was wonderful.  They have since formed a partnership.  They travel together and he is much happier.  He notes that he can never get back what he had with his wife but does feel better and is happy now.  The major thing keeping him from being physically active as far as exercise is knee pain.  Today, he***  ROS: Pertinent ROS in HPI  Studies Reviewed     CT angio 11/20/2016 CLINICAL DATA:  Shortness of breath and wheezing.   EXAM: CT ANGIOGRAPHY CHEST WITH CONTRAST   TECHNIQUE: Multidetector CT imaging of the chest was performed using the standard protocol during bolus administration of intravenous contrast. Multiplanar CT image reconstructions and MIPs were obtained to evaluate the vascular anatomy.   CONTRAST:  100 cc Isovue  370   COMPARISON:  09/17/2014   FINDINGS: Cardiovascular: Heart size is at upper limits of normal. No pericardial effusion no thoracic  aortic aneurysm. Study limited due to patient breathing during image acquisition. There is no large central pulmonary embolus. No evidence for lobar pulmonary embolus. Segmental and subsegmental pulmonary arteries to the lower lobes are not reliably evaluated given the motion degradation.   Mediastinum/Nodes: No mediastinal lymphadenopathy. There is no hilar lymphadenopathy. The esophagus has normal imaging features. There is no axillary lymphadenopathy.   Lungs/Pleura: Basilar scarring posteriorly on the left is similar. There is dependent atelectasis in both lungs. No pulmonary edema or focal airspace consolidation.   Upper Abdomen: 3.3 cm probable cyst upper pole right kidney incompletely visualized.   Musculoskeletal: Bone windows reveal no worrisome lytic or sclerotic osseous lesions.   Review of the MIP images confirms the above findings.   IMPRESSION: 1. No large central pulmonary embolus. Respiratory motion artifact degrades image quality through the lung bases making assessment of segmental and subsegmental pulmonary arteries to the lower lobes unreliable. 2. No pulmonary edema, pleural effusion, or focal airspace consolidation.     Electronically Signed   By: Camellia Candle M.D.   On: 11/20/2016 10:38 Risk Assessment/Calculations {Does this patient have ATRIAL FIBRILLATION?:[phone]} No BP recorded.  {Refresh Note OR Click here to enter BP  :1}***       Physical Exam VS:  There were no vitals taken for this visit.       Wt Readings from Last 3 Encounters:  12/05/22 230 lb 6.4 oz (104.5 kg)  04/07/21 231 lb (104.8 kg)  05/26/20 230 lb  9.6 oz (104.6 kg)    GEN: Well nourished, well developed in no acute distress NECK: No JVD; No carotid bruits CARDIAC: ***RRR, no murmurs, rubs, gallops RESPIRATORY:  Clear to auscultation without rales, wheezing or rhonchi  ABDOMEN: Soft, non-tender, non-distended EXTREMITIES:  No edema; No deformity   ASSESSMENT AND  PLAN Chronic CAD Hypertension Hyperlipidemia History of pulmonary embolism (chronic) Obesity Tachycardia     {Are you ordering a CV Procedure (e.g. stress test, cath, DCCV, TEE, etc)?   Press F2        :[id number]}  Dispo: ***  Signed, Orren LOISE Fabry, PA-C

## 2023-12-12 ENCOUNTER — Ambulatory Visit: Payer: PRIVATE HEALTH INSURANCE | Admitting: Physician Assistant

## 2023-12-12 ENCOUNTER — Other Ambulatory Visit (HOSPITAL_BASED_OUTPATIENT_CLINIC_OR_DEPARTMENT_OTHER): Payer: Self-pay

## 2023-12-12 DIAGNOSIS — R000 Tachycardia, unspecified: Secondary | ICD-10-CM

## 2023-12-12 DIAGNOSIS — E785 Hyperlipidemia, unspecified: Secondary | ICD-10-CM

## 2023-12-12 DIAGNOSIS — E66811 Obesity, class 1: Secondary | ICD-10-CM

## 2023-12-12 DIAGNOSIS — I10 Essential (primary) hypertension: Secondary | ICD-10-CM

## 2023-12-12 DIAGNOSIS — Z86711 Personal history of pulmonary embolism: Secondary | ICD-10-CM

## 2023-12-12 DIAGNOSIS — I2510 Atherosclerotic heart disease of native coronary artery without angina pectoris: Secondary | ICD-10-CM

## 2023-12-13 ENCOUNTER — Other Ambulatory Visit (HOSPITAL_BASED_OUTPATIENT_CLINIC_OR_DEPARTMENT_OTHER): Payer: Self-pay

## 2023-12-20 ENCOUNTER — Other Ambulatory Visit: Payer: Self-pay

## 2023-12-20 ENCOUNTER — Other Ambulatory Visit (HOSPITAL_BASED_OUTPATIENT_CLINIC_OR_DEPARTMENT_OTHER): Payer: Self-pay

## 2024-01-02 ENCOUNTER — Ambulatory Visit: Payer: PRIVATE HEALTH INSURANCE | Admitting: Emergency Medicine

## 2024-01-03 ENCOUNTER — Other Ambulatory Visit (HOSPITAL_BASED_OUTPATIENT_CLINIC_OR_DEPARTMENT_OTHER): Payer: Self-pay

## 2024-01-03 MED ORDER — OXYCODONE HCL 10 MG PO TABS
10.0000 mg | ORAL_TABLET | Freq: Four times a day (QID) | ORAL | 0 refills | Status: DC
  Filled 2024-01-04: qty 120, 30d supply, fill #0

## 2024-01-04 ENCOUNTER — Other Ambulatory Visit: Payer: Self-pay

## 2024-01-04 ENCOUNTER — Other Ambulatory Visit (HOSPITAL_BASED_OUTPATIENT_CLINIC_OR_DEPARTMENT_OTHER): Payer: Self-pay

## 2024-01-16 ENCOUNTER — Other Ambulatory Visit (HOSPITAL_BASED_OUTPATIENT_CLINIC_OR_DEPARTMENT_OTHER): Payer: Self-pay

## 2024-01-17 ENCOUNTER — Other Ambulatory Visit: Payer: Self-pay

## 2024-01-17 ENCOUNTER — Other Ambulatory Visit (HOSPITAL_BASED_OUTPATIENT_CLINIC_OR_DEPARTMENT_OTHER): Payer: Self-pay

## 2024-01-17 MED ORDER — ALISKIREN FUMARATE 150 MG PO TABS
150.0000 mg | ORAL_TABLET | Freq: Every day | ORAL | 1 refills | Status: DC
  Filled 2024-01-17: qty 90, 90d supply, fill #0

## 2024-01-17 MED ORDER — PROMETHAZINE HCL 6.25 MG/5ML PO SOLN
6.2500 mg | ORAL | 1 refills | Status: DC
  Filled 2024-01-17: qty 300, 11d supply, fill #0

## 2024-01-18 ENCOUNTER — Other Ambulatory Visit: Payer: Self-pay

## 2024-01-18 ENCOUNTER — Other Ambulatory Visit (HOSPITAL_BASED_OUTPATIENT_CLINIC_OR_DEPARTMENT_OTHER): Payer: Self-pay

## 2024-01-31 ENCOUNTER — Other Ambulatory Visit (HOSPITAL_BASED_OUTPATIENT_CLINIC_OR_DEPARTMENT_OTHER): Payer: Self-pay

## 2024-01-31 MED ORDER — ALISKIREN FUMARATE 150 MG PO TABS: 150.0000 mg | ORAL_TABLET | Freq: Every day | ORAL | 1 refills | Status: DC

## 2024-01-31 MED ORDER — OXYCODONE HCL 10 MG PO TABS
10.0000 mg | ORAL_TABLET | Freq: Four times a day (QID) | ORAL | 0 refills | Status: DC
  Filled 2024-03-01: qty 120, 30d supply, fill #0
  Filled ????-??-??: fill #0

## 2024-01-31 MED ORDER — OXYCODONE HCL 10 MG PO TABS
10.0000 mg | ORAL_TABLET | Freq: Four times a day (QID) | ORAL | 0 refills | Status: DC
  Filled 2024-02-01: qty 120, 30d supply, fill #0

## 2024-02-01 ENCOUNTER — Other Ambulatory Visit: Payer: Self-pay

## 2024-02-01 ENCOUNTER — Other Ambulatory Visit (HOSPITAL_BASED_OUTPATIENT_CLINIC_OR_DEPARTMENT_OTHER): Payer: Self-pay

## 2024-02-02 ENCOUNTER — Other Ambulatory Visit (HOSPITAL_BASED_OUTPATIENT_CLINIC_OR_DEPARTMENT_OTHER): Payer: Self-pay

## 2024-02-05 ENCOUNTER — Other Ambulatory Visit (HOSPITAL_COMMUNITY): Payer: Self-pay

## 2024-02-05 ENCOUNTER — Other Ambulatory Visit (HOSPITAL_BASED_OUTPATIENT_CLINIC_OR_DEPARTMENT_OTHER): Payer: Self-pay

## 2024-02-21 ENCOUNTER — Other Ambulatory Visit (HOSPITAL_BASED_OUTPATIENT_CLINIC_OR_DEPARTMENT_OTHER): Payer: Self-pay

## 2024-02-26 ENCOUNTER — Other Ambulatory Visit (HOSPITAL_BASED_OUTPATIENT_CLINIC_OR_DEPARTMENT_OTHER): Payer: Self-pay

## 2024-03-01 ENCOUNTER — Other Ambulatory Visit (HOSPITAL_BASED_OUTPATIENT_CLINIC_OR_DEPARTMENT_OTHER): Payer: Self-pay

## 2024-03-01 ENCOUNTER — Other Ambulatory Visit: Payer: Self-pay

## 2024-03-06 ENCOUNTER — Other Ambulatory Visit (HOSPITAL_BASED_OUTPATIENT_CLINIC_OR_DEPARTMENT_OTHER): Payer: Self-pay

## 2024-03-06 MED ORDER — OXYCODONE HCL 10 MG PO TABS
10.0000 mg | ORAL_TABLET | Freq: Four times a day (QID) | ORAL | 0 refills | Status: DC
  Filled 2024-03-06 – 2024-03-29 (×2): qty 120, 30d supply, fill #0

## 2024-03-07 ENCOUNTER — Other Ambulatory Visit (HOSPITAL_BASED_OUTPATIENT_CLINIC_OR_DEPARTMENT_OTHER): Payer: Self-pay

## 2024-03-07 ENCOUNTER — Other Ambulatory Visit: Payer: Self-pay

## 2024-03-08 ENCOUNTER — Ambulatory Visit: Payer: PRIVATE HEALTH INSURANCE | Admitting: Cardiology

## 2024-03-08 VITALS — BP 148/96 | HR 67 | Ht 70.0 in | Wt 232.0 lb

## 2024-03-08 DIAGNOSIS — I10 Essential (primary) hypertension: Secondary | ICD-10-CM

## 2024-03-09 NOTE — Progress Notes (Signed)
°  Cardiology Office Note:  .   Date:  03/09/2024  ID:  Jesus [name], DOB 03-17-1946, MRN [id number] PCP: Ilah Crigler, MD  [facility] Providers Cardiologist:  Alm Clay, MD {  Studies Reviewed: SABRA   EKG Interpretation Date/Time:  Friday March 08 2024 16:25:50 EST Ventricular Rate:  67 PR Interval:  258 QRS Duration:  120 QT Interval:  418 QTC Calculation: 441 R Axis:   -7  Text Interpretation: Sinus rhythm with 1st degree A-V block with Premature atrial complexes Right bundle branch block When compared with ECG of 05-Dec-2022 09:40, Premature atrial complexes are now Present Right bundle branch block is now Present Confirmed by Clay Alm ([id number]) on 03/09/2024 3:22:02 PM    The patient presented for routine cardiology visit, but due to prolonged wait, he had to leave prior to being seen.  He did have an EKG performed which was read and will be used for his upcoming visit on 03/11/2024.     Signed, Alm MICAEL Clay, MD, MS Alm Clay, M.D., M.S. Interventional Cardiologist  [facility] Pager # [phone]

## 2024-03-11 ENCOUNTER — Encounter: Payer: Self-pay | Admitting: Cardiology

## 2024-03-11 ENCOUNTER — Ambulatory Visit: Attending: Cardiology | Admitting: Cardiology

## 2024-03-11 ENCOUNTER — Other Ambulatory Visit (HOSPITAL_BASED_OUTPATIENT_CLINIC_OR_DEPARTMENT_OTHER): Payer: Self-pay

## 2024-03-11 VITALS — BP 134/90 | HR 67 | Ht 70.0 in | Wt 230.0 lb

## 2024-03-11 DIAGNOSIS — Z86711 Personal history of pulmonary embolism: Secondary | ICD-10-CM | POA: Diagnosis present

## 2024-03-11 DIAGNOSIS — I10 Essential (primary) hypertension: Secondary | ICD-10-CM | POA: Insufficient documentation

## 2024-03-11 DIAGNOSIS — I2510 Atherosclerotic heart disease of native coronary artery without angina pectoris: Secondary | ICD-10-CM | POA: Insufficient documentation

## 2024-03-11 DIAGNOSIS — E785 Hyperlipidemia, unspecified: Secondary | ICD-10-CM | POA: Diagnosis present

## 2024-03-11 MED ORDER — APIXABAN 5 MG PO TABS
5.0000 mg | ORAL_TABLET | Freq: Two times a day (BID) | ORAL | 3 refills | Status: AC
  Filled 2024-03-11: qty 60, 30d supply, fill #0
  Filled 2024-04-04: qty 60, 30d supply, fill #1
  Filled 2024-05-02: qty 60, 30d supply, fill #2

## 2024-03-11 MED ORDER — CHLORTHALIDONE 25 MG PO TABS: 12.5000 mg | ORAL_TABLET | Freq: Every day | ORAL | 3 refills | Status: DC

## 2024-03-11 MED ORDER — ALISKIREN FUMARATE 150 MG PO TABS: 150.0000 mg | ORAL_TABLET | Freq: Every morning | ORAL | 3 refills | Status: DC

## 2024-03-11 MED ORDER — TAMSULOSIN HCL 0.4 MG PO CAPS
0.4000 mg | ORAL_CAPSULE | Freq: Every day | ORAL | 1 refills | Status: AC
  Filled 2024-03-11: qty 90, 90d supply, fill #0

## 2024-03-11 MED ORDER — ALISKIREN FUMARATE 150 MG PO TABS
150.0000 mg | ORAL_TABLET | Freq: Every morning | ORAL | 3 refills | Status: AC
  Filled 2024-03-11 – 2024-04-04 (×4): qty 90, 90d supply, fill #0

## 2024-03-11 MED ORDER — CHLORTHALIDONE 25 MG PO TABS
12.5000 mg | ORAL_TABLET | Freq: Every day | ORAL | 3 refills | Status: AC
  Filled 2024-03-11 – 2024-03-12 (×3): qty 45, 90d supply, fill #0

## 2024-03-11 NOTE — Progress Notes (Unsigned)
 Cardiology Office Note:  .   Date:  03/11/2024  ID:  [name], DOB [birth year], MRN [id number] PCP: Ilah Crigler, MD  [facility] Providers Cardiologist:  Alm Clay, MD { Click to update primary MD,subspecialty MD or APP then REFRESH:1}    No chief complaint on file.   Patient Profile: .     Jesus [name] is a 78 y.o. 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  At the request of Ilah Crigler, MD.  Wife, Barnie died last year.      Jesus [name] was last seen on December 05, 2022 as a new patient .  He was doing well without any active cardiac symptoms.  He was doing better from an emotional standpoint, after the loss of his wife.  He had a significant other that he was traveling with for companionship but not have any plans to get married.  He noted occasional edema but otherwise was asymptomatic.  Subjective  Discussed the use of AI scribe software for clinical note transcription with the patient, who gave verbal consent to proceed.  History of Present Illness    Cardiovascular ROS: {roscv:310661}  ROS:  Review of Systems - {ros master:310782}    Objective    Studies Reviewed: SABRA      EKG Interpretation Date/Time:                           Friday March 08 2024 16:25:50 EST Ventricular Rate:  67 PR Interval:                         258 QRS Duration:                     120 QT Interval:                         418 QTC Calculation:441 R Axis:                                 -7   Text Interpretation:Sinus rhythm with 1st degree A-V block with Premature atrial complexes Right bundle branch block When compared with ECG of 05-Dec-2022 09:40, Premature atrial complexes are now Present Right bundle branch block is now Present Confirmed by Clay Alm ([id number]) on 03/09/2024 3:22:02 PM    I do not see recent labs. Results  Sleep Study/OSA Evaluation  04/11/2021: Noted safety primary snoring discussed sleep hygiene but no significant evidence of obstructive sleep apnea.  Mild oxygen  desaturation only noted.  EKG showed PACs. ECHO 09/17/2014: Normal LV size and function.  EF 55 to 60%.  No RWMA.  RV not well-seen but there was abnormal septal motion with the patient tachycardia suggesting mild pressure overload.  Risk Assessment/Calculations:     The patient's 1st BP is elevated (>139/89)*** Repeat BP and {Click to enter a 2nd BP Refresh Note  :1}         Physical Exam:   VS:  BP (!) 134/90   Pulse 67   Ht 5' 10 (1.778 m)   Wt 230 lb (104.3 kg)   SpO2 98%   BMI 33.00 kg/m    Wt Readings from Last 3 Encounters:  03/11/24 230 lb (104.3 kg)  03/08/24 232  lb (105.2 kg)  12/05/22 230 lb 6.4 oz (104.5 kg)    Physical Exam    GEN: Well nourished, well developed in no acute distress; *** NECK: No JVD; No carotid bruits CARDIAC: Normal S1, S2; RRR, no murmurs, rubs, gallops RESPIRATORY:  Clear to auscultation without rales, wheezing or rhonchi ; nonlabored, good air movement. ABDOMEN: Soft, non-tender, non-distended EXTREMITIES:  No edema; No deformity      ASSESSMENT AND PLAN: .    Problem List Items Addressed This Visit       Cardiology Problems   Cardiovascular disease (Chronic)   Relevant Orders   EKG 12-Lead (Completed)   Essential hypertension - Primary (Chronic)   Relevant Orders   EKG 12-Lead (Completed)    Assessment and Plan Assessment & Plan        {Are you ordering a CV Procedure (e.g. stress test, cath, DCCV, TEE, etc)?   Press F2        :[id number]}   Follow-Up: No follow-ups on file.  I spent *** minutes in the care of [facility]ans today including {CHL AMB CAR Time Based Billing Options STW (Optional):[phone]::documenting in the encounter.}      Signed, Alm MICAEL Clay, MD, MS Alm Clay, M.D., M.S. Interventional Cardiologist  [facility] Pager # [phone]

## 2024-03-11 NOTE — Patient Instructions (Signed)
 Medication Instructions:   Restart taking Chlorthalidone  12.5 mg  day  ( 1/2 tablet of 25 mg)   *If you need a refill on your cardiac medications before your next appointment, please call your pharmacy*   Lab Work: CBC CMP LIPID If you have labs (blood work) drawn today and your tests are completely normal, you will receive your results only by: MyChart Message (if you have MyChart) OR A paper copy in the mail If you have any lab test that is abnormal or we need to change your treatment, we will call you to review the results.   Testing/Procedures: Not needed   Follow-Up: At [facility], you and your health needs are our priority.  As part of our continuing mission to provide you with exceptional heart care, we have created designated Provider Care Teams.  These Care Teams include your primary Cardiologist (physician) and Advanced Practice Providers (APPs -  Physician Assistants and Nurse Practitioners) who all work together to provide you with the care you need, when you need it.     Your next appointment:   12 month(s)  The format for your next appointment:   In Person  Provider:   Alm Clay, MD

## 2024-03-12 ENCOUNTER — Other Ambulatory Visit (HOSPITAL_BASED_OUTPATIENT_CLINIC_OR_DEPARTMENT_OTHER): Payer: Self-pay

## 2024-03-12 MED ORDER — FLUTICASONE PROPIONATE 50 MCG/ACT NA SUSP
2.0000 | Freq: Every day | NASAL | 2 refills | Status: AC
  Filled 2024-03-12: qty 16, 30d supply, fill #0
  Filled 2024-04-07: qty 16, 30d supply, fill #1

## 2024-03-13 LAB — CBC
Hematocrit: 47.1 % (ref 37.5–51.0)
Hemoglobin: 15.2 g/dL (ref 13.0–17.7)
MCH: 29.1 pg (ref 26.6–33.0)
MCHC: 32.3 g/dL (ref 31.5–35.7)
MCV: 90 fL (ref 79–97)
Platelets: 175 x10E3/uL (ref 150–450)
RBC: 5.23 x10E6/uL (ref 4.14–5.80)
RDW: 14.1 % (ref 11.6–15.4)
WBC: 5.2 x10E3/uL (ref 3.4–10.8)

## 2024-03-13 LAB — COMPREHENSIVE METABOLIC PANEL WITH GFR
ALT: 15 IU/L (ref 0–44)
AST: 17 IU/L (ref 0–40)
Albumin: 4.5 g/dL (ref 3.8–4.8)
Alkaline Phosphatase: 67 IU/L (ref 47–123)
BUN/Creatinine Ratio: 12 (ref 10–24)
BUN: 13 mg/dL (ref 8–27)
Bilirubin Total: 0.6 mg/dL (ref 0.0–1.2)
CO2: 24 mmol/L (ref 20–29)
Calcium: 10.2 mg/dL (ref 8.6–10.2)
Chloride: 104 mmol/L (ref 96–106)
Creatinine, Ser: 1.07 mg/dL (ref 0.76–1.27)
Globulin, Total: 2.2 g/dL (ref 1.5–4.5)
Glucose: 96 mg/dL (ref 70–99)
Potassium: 4 mmol/L (ref 3.5–5.2)
Sodium: 142 mmol/L (ref 134–144)
Total Protein: 6.7 g/dL (ref 6.0–8.5)
eGFR: 72 mL/min/1.73 (ref >59)

## 2024-03-13 LAB — LIPID PANEL
Chol/HDL Ratio: 2.4 ratio (ref 0.0–5.0)
Cholesterol, Total: 130 mg/dL (ref 100–199)
HDL: 54 mg/dL (ref >39)
LDL Chol Calc (NIH): 58 mg/dL (ref 0–99)
Triglycerides: 96 mg/dL (ref 0–149)
VLDL Cholesterol Cal: 18 mg/dL (ref 5–40)

## 2024-03-14 ENCOUNTER — Ambulatory Visit: Payer: Self-pay | Admitting: Cardiology

## 2024-03-15 ENCOUNTER — Encounter: Payer: Self-pay | Admitting: Cardiology

## 2024-03-15 NOTE — Assessment & Plan Note (Signed)
 On long-term anticoagulation with Eliquis . No issues reported. - Continue Eliquis  5 mg twice daily.

## 2024-03-15 NOTE — Assessment & Plan Note (Deleted)
 On long-term anticoagulation with Eliquis . No issues reported. - Continue Eliquis  5 mg twice daily.

## 2024-03-15 NOTE — Assessment & Plan Note (Addendum)
 Blood pressure elevated but improved. Current regimen includes Tekturna  150 mg daily, nebivolol  milligram daily. Chlorthalidone  to be restarted for diastolic pressure control. - Restarted chlorthalidone  12.5 mg daily.  (Refill Tekturna /aliskiren ) - Ordered blood work.

## 2024-03-15 NOTE — Assessment & Plan Note (Signed)
 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  No active angina symptoms.  Continue to titrate BP medications Assess lipid panel-previous labs showed relative well-controlled lipids on no medications.

## 2024-03-15 NOTE — Assessment & Plan Note (Signed)
 Check lipid panel.  Consider treatment if levels are elevated.

## 2024-03-29 ENCOUNTER — Other Ambulatory Visit (HOSPITAL_BASED_OUTPATIENT_CLINIC_OR_DEPARTMENT_OTHER): Payer: Self-pay

## 2024-04-04 ENCOUNTER — Other Ambulatory Visit: Payer: Self-pay

## 2024-04-04 ENCOUNTER — Other Ambulatory Visit (HOSPITAL_BASED_OUTPATIENT_CLINIC_OR_DEPARTMENT_OTHER): Payer: Self-pay

## 2024-04-04 MED ORDER — NEBIVOLOL HCL 20 MG PO TABS
20.0000 mg | ORAL_TABLET | Freq: Every day | ORAL | 1 refills | Status: AC
  Filled 2024-04-04 – 2024-04-27 (×2): qty 90, 90d supply, fill #0

## 2024-04-15 ENCOUNTER — Other Ambulatory Visit: Payer: Self-pay

## 2024-04-24 ENCOUNTER — Other Ambulatory Visit (HOSPITAL_BASED_OUTPATIENT_CLINIC_OR_DEPARTMENT_OTHER): Payer: Self-pay

## 2024-04-24 MED ORDER — OXYCODONE HCL 10 MG PO TABS
10.0000 mg | ORAL_TABLET | Freq: Four times a day (QID) | ORAL | 0 refills | Status: AC
  Filled 2024-04-26: qty 120, 30d supply, fill #0

## 2024-04-26 ENCOUNTER — Other Ambulatory Visit (HOSPITAL_BASED_OUTPATIENT_CLINIC_OR_DEPARTMENT_OTHER): Payer: Self-pay

## 2024-04-26 ENCOUNTER — Other Ambulatory Visit: Payer: Self-pay

## 2024-04-27 ENCOUNTER — Other Ambulatory Visit (HOSPITAL_BASED_OUTPATIENT_CLINIC_OR_DEPARTMENT_OTHER): Payer: Self-pay
